# Patient Record
Sex: Female | Born: 1945 | Race: White | Hispanic: No | Marital: Married | Smoking: Never smoker
Health system: Southern US, Community
[De-identification: ages and names within clinical notes are randomized; demographics above are authoritative.]

## PROBLEM LIST (undated history)

## (undated) DIAGNOSIS — R011 Cardiac murmur, unspecified: Secondary | ICD-10-CM

## (undated) DIAGNOSIS — M1990 Unspecified osteoarthritis, unspecified site: Secondary | ICD-10-CM

## (undated) DIAGNOSIS — K219 Gastro-esophageal reflux disease without esophagitis: Secondary | ICD-10-CM

## (undated) DIAGNOSIS — M549 Dorsalgia, unspecified: Secondary | ICD-10-CM

## (undated) DIAGNOSIS — E119 Type 2 diabetes mellitus without complications: Secondary | ICD-10-CM

## (undated) DIAGNOSIS — I10 Essential (primary) hypertension: Secondary | ICD-10-CM

## (undated) HISTORY — PX: CARPAL TUNNEL RELEASE: SHX101

## (undated) HISTORY — PX: TONSILLECTOMY: SUR1361

## (undated) HISTORY — PX: ABDOMINAL HYSTERECTOMY: SHX81

## (undated) HISTORY — PX: BACK SURGERY: SHX140

---

## 1997-09-22 ENCOUNTER — Ambulatory Visit (HOSPITAL_COMMUNITY): Admission: RE | Admit: 1997-09-22 | Discharge: 1997-09-22 | Payer: Self-pay | Admitting: Internal Medicine

## 1999-01-14 ENCOUNTER — Ambulatory Visit (HOSPITAL_COMMUNITY): Admission: RE | Admit: 1999-01-14 | Discharge: 1999-01-14 | Payer: Self-pay | Admitting: Internal Medicine

## 1999-01-14 ENCOUNTER — Encounter: Payer: Self-pay | Admitting: Internal Medicine

## 1999-08-08 ENCOUNTER — Other Ambulatory Visit: Admission: RE | Admit: 1999-08-08 | Discharge: 1999-08-08 | Payer: Self-pay | Admitting: Internal Medicine

## 2000-11-08 ENCOUNTER — Encounter (INDEPENDENT_AMBULATORY_CARE_PROVIDER_SITE_OTHER): Payer: Self-pay | Admitting: *Deleted

## 2000-11-08 ENCOUNTER — Ambulatory Visit (HOSPITAL_COMMUNITY): Admission: RE | Admit: 2000-11-08 | Discharge: 2000-11-08 | Payer: Self-pay | Admitting: Gastroenterology

## 2010-06-10 ENCOUNTER — Emergency Department (HOSPITAL_COMMUNITY): Payer: Medicare Other

## 2010-06-10 ENCOUNTER — Emergency Department (HOSPITAL_COMMUNITY)
Admission: EM | Admit: 2010-06-10 | Discharge: 2010-06-10 | Disposition: A | Discharge status: Home / Self Care | Payer: Medicare Other | Attending: Emergency Medicine | Admitting: Emergency Medicine

## 2010-06-10 ENCOUNTER — Other Ambulatory Visit: Payer: Self-pay

## 2010-06-10 DIAGNOSIS — E119 Type 2 diabetes mellitus without complications: Secondary | ICD-10-CM | POA: Insufficient documentation

## 2010-06-10 DIAGNOSIS — M719 Bursopathy, unspecified: Secondary | ICD-10-CM | POA: Insufficient documentation

## 2010-06-10 DIAGNOSIS — R42 Dizziness and giddiness: Secondary | ICD-10-CM | POA: Insufficient documentation

## 2010-06-10 DIAGNOSIS — I10 Essential (primary) hypertension: Secondary | ICD-10-CM | POA: Insufficient documentation

## 2010-06-10 DIAGNOSIS — M549 Dorsalgia, unspecified: Secondary | ICD-10-CM | POA: Insufficient documentation

## 2010-06-10 DIAGNOSIS — M25519 Pain in unspecified shoulder: Secondary | ICD-10-CM | POA: Insufficient documentation

## 2010-06-10 DIAGNOSIS — E7800 Pure hypercholesterolemia, unspecified: Secondary | ICD-10-CM | POA: Insufficient documentation

## 2010-06-10 DIAGNOSIS — R110 Nausea: Secondary | ICD-10-CM | POA: Insufficient documentation

## 2010-06-10 DIAGNOSIS — M67919 Unspecified disorder of synovium and tendon, unspecified shoulder: Secondary | ICD-10-CM | POA: Insufficient documentation

## 2010-06-10 LAB — BASIC METABOLIC PANEL
BUN: 14 mg/dL (ref 6–23)
CO2: 29 mEq/L (ref 19–32)
Calcium: 10 mg/dL (ref 8.4–10.5)
Chloride: 100 mEq/L (ref 96–112)
Creatinine, Ser: 1.03 mg/dL (ref 0.4–1.2)
GFR calc Af Amer: >60 mL/min (ref >60)
GFR calc non Af Amer: 54 mL/min — ABNORMAL LOW (ref >60)
Glucose, Bld: 103 mg/dL — ABNORMAL HIGH (ref 70–99)
Potassium: 4.3 mEq/L (ref 3.5–5.1)
Sodium: 140 mEq/L (ref 135–145)

## 2010-06-10 LAB — CBC
HCT: 40.2 % (ref 36.0–46.0)
Hemoglobin: 14.1 g/dL (ref 12.0–15.0)
MCH: 31.5 pg (ref 26.0–34.0)
MCHC: 35.1 g/dL (ref 30.0–36.0)
MCV: 89.7 fL (ref 78.0–100.0)
Platelets: 221 10*3/uL (ref 150–400)
RBC: 4.48 MIL/uL (ref 3.87–5.11)
RDW: 12.5 % (ref 11.5–15.5)
WBC: 8.3 10*3/uL (ref 4.0–10.5)

## 2010-06-10 LAB — DIFFERENTIAL
Basophils Absolute: 0.1 10*3/uL (ref 0.0–0.1)
Basophils Relative: 1 % (ref 0–1)
Eosinophils Absolute: 0.3 10*3/uL (ref 0.0–0.7)
Eosinophils Relative: 4 % (ref 0–5)
Lymphocytes Relative: 30 % (ref 12–46)
Lymphs Abs: 2.5 10*3/uL (ref 0.7–4.0)
Monocytes Absolute: 0.5 10*3/uL (ref 0.1–1.0)
Monocytes Relative: 5 % (ref 3–12)
Neutro Abs: 5 10*3/uL (ref 1.7–7.7)
Neutrophils Relative %: 60 % (ref 43–77)

## 2010-06-10 LAB — D-DIMER, QUANTITATIVE: D-Dimer, Quant: 0.55 ug/mL-FEU — ABNORMAL HIGH (ref 0.00–0.48)

## 2010-06-10 LAB — URINALYSIS, ROUTINE W REFLEX MICROSCOPIC
Bilirubin Urine: NEGATIVE
Ketones, ur: NEGATIVE mg/dL
Nitrite: NEGATIVE
Protein, ur: NEGATIVE mg/dL
pH: 5.5 (ref 5.0–8.0)

## 2010-06-10 LAB — POCT CARDIAC MARKERS
Myoglobin, poc: 65.6 ng/mL (ref 12–200)
Troponin i, poc: <0.05 ng/mL (ref 0.00–0.09)

## 2010-06-10 LAB — URINE MICROSCOPIC-ADD ON

## 2010-06-10 MED ORDER — IOHEXOL 300 MG/ML  SOLN
100.0000 mL | Freq: Once | INTRAMUSCULAR | Status: AC | PRN
  Administered 2010-06-10: 100 mL via INTRAVENOUS

## 2010-09-09 NOTE — Procedures (Signed)
[facility]  Patient:    Robyn [name], Robyn [name]                     MRN: [id number] Proc. Date: 11/08/00 Attending:  Verlin Grills, M.D. CC:         Modesta Messing, M.D.   Procedure Report  PROCEDURE PERFORMED:  Colonoscopy and rectal polypectomy.  ENDOSCOPIST:  Verlin Grills, M.D.  INDICATIONS:  Ms. Robyn [name] (date of birth 11/04/1945) is a 65 year old female who is due for her first surveillance colonoscopy with polypectomy to prevent colon cancer.  I discussed with Ms. Robyn [name] the complications associated with colonoscopy and polypectomy including a 15 per 1000 risk of bleeding and 4 per 1000 risk of colon rupture requiring emergency surgery.  Ms. Robyn [name] has signed the operative permit.  PREMEDICATIONS:  Demerol 50 mg and Versed 10 mg.  ENDOSCOPE:  Olympus pediatric colonoscope.  DESCRIPTION OF PROCEDURE:  After obtaining informed consent, Ms. Robyn [name] was placed in the left lateral decubitus position.  I administered intravenous Demerol and intravenous Versed to achieve conscious sedation for the procedure.  The patients blood pressure, oxygen saturation, and cardiac rhythm were monitored throughout the procedure and documented in the medical record.  Anal inspection was normal.  Digital rectal exam was normal.  The Olympus pediatric video colonoscope was introduced into the rectum and easily advanced to the cecum.  Colonic preparation for the exam today was excellent.  Rectum:  From the mid rectum, a 3 mm sessile polyp was removed with electrocautery snare and submitted for pathologic interpretation.  Sigmoid colon and descending colon normal.  Splenic flexure normal. Transverse colon normal.  Hepatic flexure normal.  Ascending colon normal.  Cecum and ileocecal valve normal.  ASSESSMENT:  From the mid rectum, a 3 mm sessile polyp was removed with the electrocautery snare.  Otherwise, normal proctocolonoscopy to the  cecum.  RECOMMENDATIONS:  If the rectal polyp returns neoplastic pathologically, Ms. Robyn [name] should undergo a repeat colonoscopy in five years.  If the rectal polyp is nonneoplastic pathologically, Ms. Robyn [name] should undergo a repeat surveillance colonoscopy in approximately 10 years. DD:  11/08/00 TD:  11/08/00 Job: [id number]

## 2011-05-25 ENCOUNTER — Other Ambulatory Visit: Payer: Self-pay | Admitting: Internal Medicine

## 2011-05-25 DIAGNOSIS — Z1231 Encounter for screening mammogram for malignant neoplasm of breast: Secondary | ICD-10-CM

## 2011-06-06 ENCOUNTER — Ambulatory Visit
Admission: RE | Admit: 2011-06-06 | Discharge: 2011-06-06 | Disposition: A | Discharge status: Home / Self Care | Payer: Medicare Other | Source: Ambulatory Visit | Attending: Internal Medicine | Admitting: Internal Medicine

## 2011-06-06 DIAGNOSIS — Z1231 Encounter for screening mammogram for malignant neoplasm of breast: Secondary | ICD-10-CM

## 2012-05-30 ENCOUNTER — Other Ambulatory Visit: Payer: Self-pay | Admitting: Internal Medicine

## 2012-05-30 DIAGNOSIS — Z1231 Encounter for screening mammogram for malignant neoplasm of breast: Secondary | ICD-10-CM

## 2012-06-18 ENCOUNTER — Ambulatory Visit
Admission: RE | Admit: 2012-06-18 | Discharge: 2012-06-18 | Disposition: A | Discharge status: Home / Self Care | Payer: Medicare Other | Source: Ambulatory Visit | Attending: Internal Medicine | Admitting: Internal Medicine

## 2012-06-18 DIAGNOSIS — Z1231 Encounter for screening mammogram for malignant neoplasm of breast: Secondary | ICD-10-CM

## 2012-06-29 ENCOUNTER — Observation Stay (HOSPITAL_COMMUNITY)
Admission: EM | Admit: 2012-06-29 | Discharge: 2012-06-30 | Disposition: A | Discharge status: Home / Self Care | Payer: Medicare Other | Attending: Family Medicine | Admitting: Family Medicine

## 2012-06-29 ENCOUNTER — Encounter (HOSPITAL_COMMUNITY): Payer: Self-pay | Admitting: Emergency Medicine

## 2012-06-29 ENCOUNTER — Emergency Department (HOSPITAL_COMMUNITY): Payer: Medicare Other

## 2012-06-29 DIAGNOSIS — E119 Type 2 diabetes mellitus without complications: Secondary | ICD-10-CM | POA: Insufficient documentation

## 2012-06-29 DIAGNOSIS — R0781 Pleurodynia: Secondary | ICD-10-CM | POA: Diagnosis present

## 2012-06-29 DIAGNOSIS — I10 Essential (primary) hypertension: Secondary | ICD-10-CM

## 2012-06-29 DIAGNOSIS — R0789 Other chest pain: Secondary | ICD-10-CM

## 2012-06-29 DIAGNOSIS — R071 Chest pain on breathing: Principal | ICD-10-CM | POA: Insufficient documentation

## 2012-06-29 HISTORY — DX: Dorsalgia, unspecified: M54.9

## 2012-06-29 HISTORY — DX: Unspecified osteoarthritis, unspecified site: M19.90

## 2012-06-29 HISTORY — DX: Type 2 diabetes mellitus without complications: E11.9

## 2012-06-29 HISTORY — DX: Essential (primary) hypertension: I10

## 2012-06-29 HISTORY — DX: Cardiac murmur, unspecified: R01.1

## 2012-06-29 LAB — COMPREHENSIVE METABOLIC PANEL
Alkaline Phosphatase: 62 U/L (ref 39–117)
BUN: 16 mg/dL (ref 6–23)
Chloride: 101 mEq/L (ref 96–112)
GFR calc Af Amer: 73 mL/min — ABNORMAL LOW (ref >90)
Glucose, Bld: 141 mg/dL — ABNORMAL HIGH (ref 70–99)
Potassium: 3.9 mEq/L (ref 3.5–5.1)
Total Bilirubin: 0.4 mg/dL (ref 0.3–1.2)

## 2012-06-29 LAB — GLUCOSE, CAPILLARY: Glucose-Capillary: 161 mg/dL — ABNORMAL HIGH (ref 70–99)

## 2012-06-29 LAB — CREATININE, SERUM
Creatinine, Ser: 0.83 mg/dL (ref 0.50–1.10)
GFR calc Af Amer: 82 mL/min — ABNORMAL LOW (ref >90)

## 2012-06-29 LAB — CBC
HCT: 36.5 % (ref 36.0–46.0)
Hemoglobin: 12.7 g/dL (ref 12.0–15.0)
MCHC: 34.8 g/dL (ref 30.0–36.0)
MCV: 89.9 fL (ref 78.0–100.0)
Platelets: 224 10*3/uL (ref 150–400)
RBC: 3.93 MIL/uL (ref 3.87–5.11)
RDW: 12.7 % (ref 11.5–15.5)
WBC: 9.9 10*3/uL (ref 4.0–10.5)
WBC: 10 10*3/uL (ref 4.0–10.5)

## 2012-06-29 LAB — POCT I-STAT TROPONIN I: Troponin i, poc: 0 ng/mL (ref 0.00–0.08)

## 2012-06-29 LAB — TROPONIN I: Troponin I: <0.3 ng/mL (ref <0.30)

## 2012-06-29 MED ORDER — SODIUM CHLORIDE 0.9 % IJ SOLN
3.0000 mL | Freq: Two times a day (BID) | INTRAMUSCULAR | Status: DC
  Administered 2012-06-29 – 2012-06-30 (×2): 3 mL via INTRAVENOUS

## 2012-06-29 MED ORDER — POLYETHYLENE GLYCOL 3350 17 G PO PACK
17.0000 g | PACK | Freq: Every day | ORAL | Status: DC | PRN
  Filled 2012-06-29: qty 1

## 2012-06-29 MED ORDER — CHOLECALCIFEROL 10 MCG (400 UNIT) PO TABS
400.0000 [IU] | ORAL_TABLET | Freq: Every day | ORAL | Status: DC
  Administered 2012-06-29 – 2012-06-30 (×2): 400 [IU] via ORAL
  Filled 2012-06-29 (×2): qty 1

## 2012-06-29 MED ORDER — MORPHINE SULFATE 4 MG/ML IJ SOLN
4.0000 mg | Freq: Once | INTRAMUSCULAR | Status: AC
  Administered 2012-06-29: 4 mg via INTRAVENOUS
  Filled 2012-06-29: qty 1

## 2012-06-29 MED ORDER — ONDANSETRON HCL 4 MG/2ML IJ SOLN: 4.0000 mg | Freq: Four times a day (QID) | INTRAMUSCULAR | Status: DC | PRN

## 2012-06-29 MED ORDER — GLIMEPIRIDE 4 MG PO TABS
4.0000 mg | ORAL_TABLET | Freq: Every day | ORAL | Status: DC
  Administered 2012-06-30: 4 mg via ORAL
  Filled 2012-06-29 (×2): qty 1

## 2012-06-29 MED ORDER — SODIUM CHLORIDE 0.9 % IV SOLN: 250.0000 mL | INTRAVENOUS | Status: DC | PRN

## 2012-06-29 MED ORDER — ASPIRIN EC 81 MG PO TBEC
81.0000 mg | DELAYED_RELEASE_TABLET | Freq: Every day | ORAL | Status: DC
  Administered 2012-06-30: 81 mg via ORAL
  Filled 2012-06-29 (×2): qty 1

## 2012-06-29 MED ORDER — FENTANYL CITRATE 0.05 MG/ML IJ SOLN
50.0000 ug | Freq: Once | INTRAMUSCULAR | Status: AC
  Administered 2012-06-29: 50 ug via INTRAVENOUS
  Filled 2012-06-29: qty 2

## 2012-06-29 MED ORDER — LISINOPRIL-HYDROCHLOROTHIAZIDE 20-25 MG PO TABS
1.0000 | ORAL_TABLET | Freq: Every day | ORAL | Status: DC
Start: 2012-06-29 — End: 2012-06-29

## 2012-06-29 MED ORDER — ATORVASTATIN CALCIUM 40 MG PO TABS
40.0000 mg | ORAL_TABLET | Freq: Every day | ORAL | Status: DC
  Filled 2012-06-29: qty 1

## 2012-06-29 MED ORDER — INSULIN ASPART 100 UNIT/ML ~~LOC~~ SOLN: 0.0000 [IU] | Freq: Every day | SUBCUTANEOUS | Status: DC

## 2012-06-29 MED ORDER — ZOLPIDEM TARTRATE 5 MG PO TABS: 5.0000 mg | ORAL_TABLET | Freq: Every evening | ORAL | Status: DC | PRN

## 2012-06-29 MED ORDER — ROSUVASTATIN CALCIUM 20 MG PO TABS
20.0000 mg | ORAL_TABLET | Freq: Every day | ORAL | Status: DC
  Administered 2012-06-29: 20 mg via ORAL
  Filled 2012-06-29 (×2): qty 1

## 2012-06-29 MED ORDER — ASPIRIN EC 325 MG PO TBEC: 325.0000 mg | DELAYED_RELEASE_TABLET | Freq: Every day | ORAL | Status: DC

## 2012-06-29 MED ORDER — SODIUM CHLORIDE 0.9 % IJ SOLN: 3.0000 mL | INTRAMUSCULAR | Status: DC | PRN

## 2012-06-29 MED ORDER — HYDROCODONE-ACETAMINOPHEN 5-325 MG PO TABS
1.0000 | ORAL_TABLET | Freq: Two times a day (BID) | ORAL | Status: DC | PRN
  Administered 2012-06-29 – 2012-06-30 (×3): 1 via ORAL
  Filled 2012-06-29 (×3): qty 1

## 2012-06-29 MED ORDER — INSULIN GLARGINE 100 UNIT/ML ~~LOC~~ SOLN
10.0000 [IU] | Freq: Every day | SUBCUTANEOUS | Status: DC
  Administered 2012-06-29: 10 [IU] via SUBCUTANEOUS

## 2012-06-29 MED ORDER — HYDROCHLOROTHIAZIDE 25 MG PO TABS
25.0000 mg | ORAL_TABLET | Freq: Every day | ORAL | Status: DC
  Administered 2012-06-30: 25 mg via ORAL
  Filled 2012-06-29: qty 1

## 2012-06-29 MED ORDER — INSULIN ASPART 100 UNIT/ML ~~LOC~~ SOLN
0.0000 [IU] | Freq: Three times a day (TID) | SUBCUTANEOUS | Status: DC
  Administered 2012-06-29: 3 [IU] via SUBCUTANEOUS
  Administered 2012-06-30: 5 [IU] via SUBCUTANEOUS
  Administered 2012-06-30: 3 [IU] via SUBCUTANEOUS

## 2012-06-29 MED ORDER — SODIUM CHLORIDE 0.9 % IJ SOLN: 3.0000 mL | Freq: Two times a day (BID) | INTRAMUSCULAR | Status: DC

## 2012-06-29 MED ORDER — HEPARIN SODIUM (PORCINE) 5000 UNIT/ML IJ SOLN
5000.0000 [IU] | Freq: Three times a day (TID) | INTRAMUSCULAR | Status: DC
  Administered 2012-06-29 – 2012-06-30 (×2): 5000 [IU] via SUBCUTANEOUS
  Filled 2012-06-29 (×5): qty 1

## 2012-06-29 MED ORDER — LISINOPRIL 20 MG PO TABS
20.0000 mg | ORAL_TABLET | Freq: Every day | ORAL | Status: DC
  Administered 2012-06-30: 20 mg via ORAL
  Filled 2012-06-29: qty 1

## 2012-06-29 MED ORDER — MORPHINE SULFATE 4 MG/ML IJ SOLN
4.0000 mg | INTRAMUSCULAR | Status: DC | PRN
  Administered 2012-06-29: 4 mg via INTRAVENOUS
  Filled 2012-06-29: qty 1

## 2012-06-29 MED ORDER — ONDANSETRON HCL 4 MG PO TABS: 4.0000 mg | ORAL_TABLET | Freq: Four times a day (QID) | ORAL | Status: DC | PRN

## 2012-06-29 MED ORDER — INSULIN ASPART 100 UNIT/ML ~~LOC~~ SOLN
4.0000 [IU] | Freq: Three times a day (TID) | SUBCUTANEOUS | Status: DC
  Administered 2012-06-29 – 2012-06-30 (×3): 4 [IU] via SUBCUTANEOUS

## 2012-06-29 NOTE — ED Notes (Signed)
Pt. Stated, i staTTED HAVING SHOULDER PAIN LAST NIGHT BEFORE GOING TO BED AND THIS AM AROUND 500 i STARTED HAVING PAIN UNDERNEATH LEFT breast.  This is new pain

## 2012-06-29 NOTE — ED Notes (Signed)
Patient states that she took 5 baby asa after calling EMS

## 2012-06-29 NOTE — ED Notes (Signed)
Urine at bedside if needed. 

## 2012-06-29 NOTE — ED Notes (Signed)
Report given to Alan, RN

## 2012-06-29 NOTE — ED Notes (Signed)
Patient transported to X-ray 

## 2012-06-29 NOTE — ED Provider Notes (Signed)
History     CSN: [id number]  Arrival date & time 06/29/12  1036   First MD Initiated Contact with Patient 06/29/12 1133      Chief Complaint  Patient presents with  . Chest Pain    (Consider location/radiation/quality/duration/timing/severity/associated sxs/prior treatment) HPI Complains of left anterior chest pain covering a 1 cm area at left costal margin onset upon awakening this morning. Patient states had shoulder pain at left side last night. Treat with aspirin prior to coming here. Pain is worse with deep inspiration or sitting upright improve with lying supine. No cough no fever.  Past Medical History  Diagnosis Date  . Diabetes mellitus without complication   . Hypertension   . Back pain    hypercholesterolemia  History reviewed. No pertinent past surgical history.  History reviewed. No pertinent family history.  History  Substance Use Topics  . Smoking status: Not on file  . Smokeless tobacco: Not on file  . Alcohol Use: Yes   not a nonsmoker no drugs   OB History   Grav Para Term Preterm Abortions TAB SAB Ect Mult Living                  Review of Systems  Cardiovascular: Positive for chest pain.  All other systems reviewed and are negative.    Allergies  Codeine  Home Medications   Current Outpatient Rx  Name  Route  Sig  Dispense  Refill  . aspirin EC 81 MG tablet   Oral   Take 81 mg by mouth daily.         . Cholecalciferol (VITAMIN D PO)   Oral   Take 1 capsule by mouth daily.         . Cyanocobalamin (VITAMIN B-12 PO)   Oral   Take 1 tablet by mouth daily.         Marland Kitchen glimepiride (AMARYL) 4 MG tablet   Oral   Take 4 mg by mouth daily before breakfast.         . glyBURIDE (DIABETA) 5 MG tablet   Oral   Take 5 mg by mouth 2 (two) times daily with a meal.         . HYDROcodone-acetaminophen (NORCO/VICODIN) 5-325 MG per tablet   Oral   Take 1 tablet by mouth 2 (two) times daily as needed for pain.         Marland Kitchen  lisinopril-hydrochlorothiazide (PRINZIDE,ZESTORETIC) 20-25 MG per tablet   Oral   Take 1 tablet by mouth daily.         . metFORMIN (GLUCOPHAGE) 1000 MG tablet   Oral   Take 1,000 mg by mouth 2 (two) times daily with a meal.         . rosuvastatin (CRESTOR) 20 MG tablet   Oral   Take 20 mg by mouth daily.         Marland Kitchen VITAMIN E PO   Oral   Take 1 capsule by mouth daily.           BP 147/75  Pulse 76  Temp(Src) 98.3 F (36.8 C) (Oral)  Resp 22  SpO2 98%  Physical Exam  Nursing note and vitals reviewed. Constitutional: She appears well-developed and well-nourished. She appears distressed.  Glasgow Coma Score 15 appears uncomfortable  HENT:  Head: Normocephalic and atraumatic.  Eyes: Conjunctivae are normal. Pupils are equal, round, and reactive to light.  Neck: Neck supple. No tracheal deviation present. No thyromegaly present.  Cardiovascular: Normal rate  and regular rhythm.   No murmur heard. Pulmonary/Chest: Effort normal and breath sounds normal. She exhibits no tenderness.  Abdominal: Soft. Bowel sounds are normal. She exhibits no distension. There is no tenderness.  Musculoskeletal: Normal range of motion. She exhibits no edema and no tenderness.  Neurological: She is alert. Coordination normal.  Skin: Skin is warm and dry. No rash noted.  Psychiatric: She has a normal mood and affect.    ED Course  Procedures (including critical care time)  Labs Reviewed  CBC  COMPREHENSIVE METABOLIC PANEL  D-DIMER, QUANTITATIVE  POCT I-STAT TROPONIN I   Dg Chest 2 View  06/29/2012  *RADIOLOGY REPORT*  Clinical Data: Chest pain  CHEST - 2 VIEW  Comparison: CTA chest dated 06/10/2010  Findings: Low lung volumes.  No focal consolidation.  No pleural effusion or pneumothorax.  The heart is top normal in size for inspiration.  Degenerative changes of the visualized thoracolumbar spine.  IMPRESSION: No evidence of acute cardiopulmonary disease.   Original Report Authenticated  By: Charline Bills, M.D.      No diagnosis found.   Date: 06/29/2012  Rate: 85  Rhythm: normal sinus rhythm  QRS Axis: normal  Intervals: normal  ST/T Wave abnormalities: normal  Conduction Disutrbances: none  Narrative Interpretation: unremarkable Chest x-ray reviewed by me Results for orders placed during the hospital encounter of 06/29/12  CBC      Result Value Range   WBC 9.9  4.0 - 10.5 K/uL   RBC 4.06  3.87 - 5.11 MIL/uL   Hemoglobin 12.7  12.0 - 15.0 g/dL   HCT 16.1  09.6 - 04.5 %   MCV 89.9  78.0 - 100.0 fL   MCH 31.3  26.0 - 34.0 pg   MCHC 34.8  30.0 - 36.0 g/dL   RDW 40.9  81.1 - 91.4 %   Platelets 248  150 - 400 K/uL  COMPREHENSIVE METABOLIC PANEL      Result Value Range   Sodium 140  135 - 145 mEq/L   Potassium 3.9  3.5 - 5.1 mEq/L   Chloride 101  96 - 112 mEq/L   CO2 28  19 - 32 mEq/L   Glucose, Bld 141 (*) 70 - 99 mg/dL   BUN 16  6 - 23 mg/dL   Creatinine, Ser 7.82  0.50 - 1.10 mg/dL   Calcium 95.6  8.4 - 21.3 mg/dL   Total Protein 7.1  6.0 - 8.3 g/dL   Albumin 3.9  3.5 - 5.2 g/dL   AST 18  0 - 37 U/L   ALT 24  0 - 35 U/L   Alkaline Phosphatase 62  39 - 117 U/L   Total Bilirubin 0.4  0.3 - 1.2 mg/dL   GFR calc non Af Amer 63 (*) >90 mL/min   GFR calc Af Amer 73 (*) >90 mL/min  D-DIMER, QUANTITATIVE      Result Value Range   D-Dimer, Quant 0.38  0.00 - 0.48 ug/mL-FEU  POCT I-STAT TROPONIN I      Result Value Range   Troponin i, poc 0.00  0.00 - 0.08 ng/mL   Comment 3            Dg Chest 2 View  06/29/2012  *RADIOLOGY REPORT*  Clinical Data: Chest pain  CHEST - 2 VIEW  Comparison: CTA chest dated 06/10/2010  Findings: Low lung volumes.  No focal consolidation.  No pleural effusion or pneumothorax.  The heart is top normal in size for inspiration.  Degenerative changes of the visualized thoracolumbar spine.  IMPRESSION: No evidence of acute cardiopulmonary disease.   Original Report Authenticated By: Charline Bills, M.D.    Mm Digital  Screening  06/18/2012  *RADIOLOGY REPORT*  Clinical Data: Screening.  DIGITAL BILATERAL SCREENING MAMMOGRAM WITH CAD  Comparison:  06/06/2011 [facility].  02/19/2008 and 06/27/2006 from [facility].  FINDINGS:  ACR Breast Density Category 2: There are scattered fibroglandular densities.  There is no suspicious dominant mass, architectural distortion, or calcification to suggest malignancy.  Images were processed with CAD.  IMPRESSION: No mammographic evidence of malignancy.  A result letter of this screening mammogram will be mailed directly to the patient.  RECOMMENDATION: Screening mammogram in one year. (Code:SM-B-01Y)  BI-RADS CATEGORY 1:  Negative.   Original Report Authenticated By: Cain Saupe, M.D.     1:40 PM pain is improved after treatment with morphine and is mild at present   MDM  I doubt acute coronary syndrome given patient's history. However she has multiple cardiac risk factors and is elderly. Spoke with Dr.Feiliz plan step down unit, 23 hours observation Diagnosis atypical chest pain        Doug Sou, MD 06/29/12 1346

## 2012-06-29 NOTE — H&P (Signed)
[facility] History and Physical  Robyn [name] RUE:[id number] DOB: 11/18/1945 DOA: 06/29/2012  Referring physician: Dr. Ethelda Chick  PCP: No primary provider on file.  Specialists: none  Chief Complaint: CP  HPI: Robyn [name] is a 67 y.o. female  Past medical history of hypertension, diabetes mellitus hyperlipidemia, and family history of heart attacks that comes in complaining of chest pain. She relates started one day prior to admission with soreness in her shoulder and this morning she started having chest pain made worse with movement but not with exertion better with morphine. Deep breath make it worst she  Can point it out with  one finger. She denies shortness of breath, fever chills or cough. In the emergency room chest x-ray was done that does not show any pneumonia, d-dimer was done that was 0.3, first set of cardiac enzymes negative. EKG shows normal sinus rhythm no T wave abnormalities and normal axis.   Review of Systems: The patient denies anorexia, fever, weight loss,, vision loss, decreased hearing, hoarseness,  syncope, dyspnea on exertion, peripheral edema, balance deficits, hemoptysis, abdominal pain, melena, hematochezia, severe indigestion/heartburn, hematuria, incontinence, genital sores, muscle weakness, suspicious skin lesions, transient blindness, difficulty walking, depression, unusual weight change, abnormal bleeding, enlarged lymph nodes, angioedema, and breast masses.    Past Medical History  Diagnosis Date  . Diabetes mellitus without complication   . Hypertension   . Back pain    Past Surgical History  Procedure Laterality Date  . Abdominal hysterectomy     Social History:  reports that she has never smoked. She does not have any smokeless tobacco history on file. She reports that she drinks about 1.2 ounces of alcohol per week. She reports that she does not use illicit drugs. His at home by herself can perform all her ADLs  Allergies  Allergen  Reactions  . Codeine Itching    Family History  Problem Relation Age of Onset  . Alzheimer's disease Mother   . Heart attack Father      Prior to Admission medications   Medication Sig Start Date End Date Taking? Authorizing Provider  aspirin EC 81 MG tablet Take 81 mg by mouth daily.   Yes Historical Provider, MD  Cholecalciferol (VITAMIN D PO) Take 1 capsule by mouth daily.   Yes Historical Provider, MD  Cyanocobalamin (VITAMIN B-12 PO) Take 1 tablet by mouth daily.   Yes Historical Provider, MD  glimepiride (AMARYL) 4 MG tablet Take 4 mg by mouth daily before breakfast.   Yes Historical Provider, MD  glyBURIDE (DIABETA) 5 MG tablet Take 5 mg by mouth 2 (two) times daily with a meal.   Yes Historical Provider, MD  HYDROcodone-acetaminophen (NORCO/VICODIN) 5-325 MG per tablet Take 1 tablet by mouth 2 (two) times daily as needed for pain.   Yes Historical Provider, MD  lisinopril-hydrochlorothiazide (PRINZIDE,ZESTORETIC) 20-25 MG per tablet Take 1 tablet by mouth daily.   Yes Historical Provider, MD  metFORMIN (GLUCOPHAGE) 1000 MG tablet Take 1,000 mg by mouth 2 (two) times daily with a meal.   Yes Historical Provider, MD  rosuvastatin (CRESTOR) 20 MG tablet Take 20 mg by mouth daily.   Yes Historical Provider, MD  VITAMIN E PO Take 1 capsule by mouth daily.   Yes Historical Provider, MD   Physical Exam: Filed Vitals:   06/29/12 1037 06/29/12 1213 06/29/12 1248 06/29/12 1358  BP: 180/88 147/75 138/63 151/72  Pulse: 85 76 80 82  Temp: 98.3 F (36.8 C)  98.2 F (36.8 C)  TempSrc: Oral  Oral   Resp: 26 22 18  97  SpO2: 98% 98% 99% 97%    BP 151/72  Pulse 82  Temp(Src) 98.2 F (36.8 C) (Oral)  Resp 97  SpO2 97%  General Appearance:    Alert, cooperative, no distress, appears stated age  Head:    Normocephalic, without obvious abnormality, atraumatic  Eyes:    PERRL, conjunctiva/corneas clear, EOM's intact, fundi    benign, both eyes  Ears:    Nose:   Throat:   dry mucous  member   Neck:   Supple, symmetrical, trachea midline, no adenopathy;    thyroid:  no enlargement/tenderness/nodules; no carotid   bruit or JVD  Back:     Symmetric, no curvature, ROM normal, no CVA tenderness  Lungs:     Clear to auscultation bilaterally, respirations unlabored  Chest Wall:    No tenderness or deformity, not reproducible by palpation    Heart:    Regular rate and rhythm, S1 and S2 normal, no murmur, rub   or gallop  Breast Exam:    No tenderness, masses, or nipple abnormality no rashes   Abdomen:     Soft, non-tender, bowel sounds active all four quadrants,    no masses, no organomegaly        Extremities:   Extremities normal, atraumatic, no cyanosis or edema  Pulses:   2+ and symmetric all extremities  Skin:   Skin color, texture, turgor normal, no rashes or lesions  Lymph nodes:   Cervical, supraclavicular, and axillary nodes normal  Neurologic:   CNII-XII intact, normal strength, sensation and reflexes    throughout    Labs on Admission:  Basic Metabolic Panel:  Recent Labs Lab 06/29/12 1140  NA 140  K 3.9  CL 101  CO2 28  GLUCOSE 141*  BUN 16  CREATININE 0.91  CALCIUM 10.4   Liver Function Tests:  Recent Labs Lab 06/29/12 1140  AST 18  ALT 24  ALKPHOS 62  BILITOT 0.4  PROT 7.1  ALBUMIN 3.9   No results found for this basename: LIPASE, AMYLASE,  in the last 168 hours No results found for this basename: AMMONIA,  in the last 168 hours CBC:  Recent Labs Lab 06/29/12 1140  WBC 9.9  HGB 12.7  HCT 36.5  MCV 89.9  PLT 248   Cardiac Enzymes: No results found for this basename: CKTOTAL, CKMB, CKMBINDEX, TROPONINI,  in the last 168 hours  BNP (last 3 results) No results found for this basename: PROBNP,  in the last 8760 hours CBG: No results found for this basename: GLUCAP,  in the last 168 hours  Radiological Exams on Admission: Dg Chest 2 View  06/29/2012  *RADIOLOGY REPORT*  Clinical Data: Chest pain  CHEST - 2 VIEW  Comparison:  CTA chest dated 06/10/2010  Findings: Low lung volumes.  No focal consolidation.  No pleural effusion or pneumothorax.  The heart is top normal in size for inspiration.  Degenerative changes of the visualized thoracolumbar spine.  IMPRESSION: No evidence of acute cardiopulmonary disease.   Original Report Authenticated By: Charline Bills, M.D.     EKG: Independently reviewed. Normal sinus rhythm, no T wave inversion  Assessment/Plan  Pleuritic chest pain: - Admit to telemetry, cycle cardiac enzymes x3, get an EKG in the morning, check a 2-D echo, TSH morphine for pain. Differential for pleuritic chest pain will be pulmonary embolism she is a low probability for any PEs and her d-dimer is less than 0.5.  We'll likely pericarditis that she doesn't have diffuse ST segment elevation, her pain is not made worse by being fed oral better by sitting up and leaning forward. It is unlikely pneumonia she has no fever cough and leukocytosis and chest x-ray does not show an infiltrate. The pain is nonreproducible by palpation, she has no fractures on chest x-ray. So go ahead and admit her for 23 hour observation. - Also check a TSH. She's currently on vitamin B12 replacement therapy. Also check an RBC folate and B12    Diabetes mellitus type 2 without retinopathy: - Continue albuterol glyburide, DC metformin and start on Lantus and sliding scale. Get a hemoglobin A1c.    Hypertension - Blood pressure is mildly elevated. Continue current home medications.    Code Status: Full Family Communication: friend Disposition Plan: observation (indicate anticipated LOS)  Time spent: 60 minutes  Marinda Elk [facility] Pager [phone]  If 7PM-7AM, please contact night-coverage [email or web address] Password [facility] 06/29/2012, 2:13 PM

## 2012-06-30 LAB — COMPREHENSIVE METABOLIC PANEL
ALT: 18 U/L (ref 0–35)
Albumin: 3.2 g/dL — ABNORMAL LOW (ref 3.5–5.2)
Alkaline Phosphatase: 52 U/L (ref 39–117)
BUN: 14 mg/dL (ref 6–23)
Chloride: 102 mEq/L (ref 96–112)
Glucose, Bld: 122 mg/dL — ABNORMAL HIGH (ref 70–99)
Potassium: 3.9 mEq/L (ref 3.5–5.1)
Sodium: 138 mEq/L (ref 135–145)
Total Bilirubin: 0.4 mg/dL (ref 0.3–1.2)
Total Protein: 6.1 g/dL (ref 6.0–8.3)

## 2012-06-30 LAB — GLUCOSE, CAPILLARY
Glucose-Capillary: 207 mg/dL — ABNORMAL HIGH (ref 70–99)
Glucose-Capillary: 163 mg/dL — ABNORMAL HIGH (ref 70–99)

## 2012-06-30 MED ORDER — MELOXICAM 15 MG PO TABS: 15.0000 mg | ORAL_TABLET | Freq: Every day | ORAL | Status: DC

## 2012-06-30 MED ORDER — OMEPRAZOLE MAGNESIUM 20 MG PO TBEC: 20.0000 mg | DELAYED_RELEASE_TABLET | Freq: Every day | ORAL | Status: DC

## 2012-06-30 NOTE — Progress Notes (Signed)
UR Completed Brenda [name]-Bigelow, RN,BSN [phone]  

## 2012-06-30 NOTE — Progress Notes (Signed)
  Echocardiogram 2D Echocardiogram has been performed.  [name], Robyn FRANCES 06/30/2012, 11:27 AM

## 2012-06-30 NOTE — Care Management Note (Unsigned)
    Page 1 of 1   06/30/2012     8:08:57 AM   CARE MANAGEMENT NOTE 06/30/2012  Patient:  Robyn [name]   Account Number:  [id number]  Date Initiated:  06/30/2012  Documentation initiated by:  [name]-BIGELOW,BRENDA  Subjective/Objective Assessment:   Pt admitted with cp.     Action/Plan:   CM will continue ot monitor for disposition needs.   Anticipated DC Date:  07/01/2012   Anticipated DC Plan:  HOME/SELF CARE      DC Planning Services  CM consult      Choice offered to / List presented to:             Status of service:  In process, will continue to follow Medicare Important Message given?   (If response is "NO", the following Medicare IM given date fields will be blank) Date Medicare IM given:   Date Additional Medicare IM given:    Discharge Disposition:    Per UR Regulation:  Reviewed for med. necessity/level of care/duration of stay  If discussed at Long Length of Stay Meetings, dates discussed:    Comments:

## 2012-06-30 NOTE — Progress Notes (Signed)
DC orders received.  Patient stable with no S/S of distress.  Medication and discharge information reviewed with patient and patient's husband.  Patient DC home with husband. [place], Jessica Marie  

## 2012-06-30 NOTE — Progress Notes (Signed)
Subjective: The patient describes pain which is in the left lateral chest and radiates around under her left breast. It is described as sharp. The pain is worse with deep breath and also with changing of positions especially lying onto her left side. It is not exertional. No shortness of breath. It is not worse when she is sitting up or lying down. Today while getting the echocardiogram the transducer pressure on her chest caused soreness  Objective: Vital signs in last 24 hours: Temp:  [98 F (36.7 C)-98.7 F (37.1 C)] 98 F (36.7 C) (03/09 0600) Pulse Rate:  [79-92] 92 (03/09 0600) Resp:  [18-97] 18 (03/09 0600) BP: (137-151)/(61-73) 139/73 mmHg (03/09 0929) SpO2:  [97 %-100 %] 97 % (03/09 0600) Weight:  [88.4 kg (194 lb 14.2 oz)-89.1 kg (196 lb 6.9 oz)] 88.4 kg (194 lb 14.2 oz) (03/09 0600) Weight change:  Last BM Date: 06/28/12  Intake/Output from previous day: 03/08 0701 - 03/09 0700 In: 240 [P.O.:240] Out: 1750 [Urine:1750] Intake/Output this shift: Total I/O In: 240 [P.O.:240] Out: 1050 [Urine:1050]  General appearance: alert and cooperative Resp: clear to auscultation bilaterally Chest wall: no tenderness, Tender left lateral chest wall Cardio: regular rate and rhythm, S1, S2 normal, no murmur, click, rub or gallop  Lab Results:  Recent Labs  06/29/12 1140 06/29/12 1615  WBC 9.9 10.0  HGB 12.7 12.3  HCT 36.5 35.6*  PLT 248 224   BMET  Recent Labs  06/29/12 1140 06/29/12 1615 06/30/12 0320  NA 140  --  138  K 3.9  --  3.9  CL 101  --  102  CO2 28  --  26  GLUCOSE 141*  --  122*  BUN 16  --  14  CREATININE 0.91 0.83 0.92  CALCIUM 10.4  --  9.0    Studies/Results: Dg Chest 2 View  06/29/2012  *RADIOLOGY REPORT*  Clinical Data: Chest pain  CHEST - 2 VIEW  Comparison: CTA chest dated 06/10/2010  Findings: Low lung volumes.  No focal consolidation.  No pleural effusion or pneumothorax.  The heart is top normal in size for inspiration.  Degenerative  changes of the visualized thoracolumbar spine.  IMPRESSION: No evidence of acute cardiopulmonary disease.   Original Report Authenticated By: Charline Bills, M.D.     Medications: I have reviewed the patient's current medications.  Assessment/Plan: Chest pain, chest wall in origin. D-dimer normal, EKG normal and exam unremarkable, doubt pericarditis or pulmonary embolus. Would be very atypical for cardiac ischemia her reflux. We'll discharge her today and treat with a short course of anti-inflammatories and pain medication   LOS: 1 day   GRIFFIN,JOHN JOSEPH 06/30/2012, 1:15 PM

## 2012-06-30 NOTE — Discharge Summary (Signed)
Physician Discharge Summary  Patient ID: Robyn [name] MRN: [id number] DOB/AGE: 1946/03/11 67 y.o.  Admit date: 06/29/2012 Discharge date: 06/30/2012  Admission Diagnoses: Chest pain, pleuritic Diabetes mellitus Hypertension  Discharge Diagnoses:  Principal Problem:  Chest wall pain Active Problems:   Diabetes mellitus type 2 without retinopathy   Hypertension   Discharged Condition: good  Hospital Course: The patient was admitted on March 8 complaining of left-sided chest pain starting in her left lateral chest area and radiating under her left breast. The pain was sharp. It was worse when she took a deep breath or lie on the left side. It was not worse with lying down. There is no shortness of breath. In ER chest x-ray was unremarkable. D-dimer was normal. EKG unremarkable. An echocardiogram was done and was pending at the time of this dictation. Based on her exam and history we felt the pain was likely chest wall in origin and we will treat this with a short course of nonsteroidals and pain medication.  Consults: None  Significant Diagnostic Studies: labs: Troponin less than 0.3, BUN and creatinine 14.92, LFTs normal, d-dimer 0.5: CXR: normal  Treatments: analgesia: Vicodin  Discharge Exam: Blood pressure 139/73, pulse 92, temperature 98 F (36.7 C), temperature source Oral, resp. rate 18, height 5\' 3"  (1.6 m), weight 88.4 kg (194 lb 14.2 oz), SpO2 97.00%. General appearance: alert and cooperative Resp: clear to auscultation bilaterally Chest wall: no tenderness, Tender left chest wall Cardio: regular rate and rhythm, S1, S2 normal, no murmur, click, rub or gallop  Disposition: 01-Home or Self Care     Medication List    TAKE these medications       aspirin EC 81 MG tablet  Take 81 mg by mouth daily.     glimepiride 4 MG tablet  Commonly known as:  AMARYL  Take 4 mg by mouth daily before breakfast.     glyBURIDE 5 MG tablet  Commonly known as:  DIABETA  Take 5  mg by mouth 2 (two) times daily with a meal.     HYDROcodone-acetaminophen 5-325 MG per tablet  Commonly known as:  NORCO/VICODIN  Take 1 tablet by mouth 2 (two) times daily as needed for pain.     lisinopril-hydrochlorothiazide 20-25 MG per tablet  Commonly known as:  PRINZIDE,ZESTORETIC  Take 1 tablet by mouth daily.     meloxicam 15 MG tablet  Commonly known as:  MOBIC  Take 1 tablet (15 mg total) by mouth daily.     metFORMIN 1000 MG tablet  Commonly known as:  GLUCOPHAGE  Take 1,000 mg by mouth 2 (two) times daily with a meal.     omeprazole 20 MG tablet  Commonly known as:  PRILOSEC OTC  Take 1 tablet (20 mg total) by mouth daily.     rosuvastatin 20 MG tablet  Commonly known as:  CRESTOR  Take 20 mg by mouth daily.     VITAMIN B-12 PO  Take 1 tablet by mouth daily.     VITAMIN D PO  Take 1 capsule by mouth daily.     VITAMIN E PO  Take 1 capsule by mouth daily.           Follow-up Information   Follow up with GATES,ROBERT NEVILL, MD.   Contact information:   [address] [place] [address] [phone]       Signed: Lillia Mountain 06/30/2012, 1:24 PM

## 2012-07-02 LAB — FOLATE RBC: RBC Folate: 2194 ng/mL — ABNORMAL HIGH (ref >=366)

## 2013-05-15 ENCOUNTER — Other Ambulatory Visit: Payer: Self-pay

## 2013-05-15 DIAGNOSIS — Z1231 Encounter for screening mammogram for malignant neoplasm of breast: Secondary | ICD-10-CM

## 2013-06-19 ENCOUNTER — Inpatient Hospital Stay: Admission: RE | Admit: 2013-06-19 | Payer: Medicare Other | Source: Ambulatory Visit

## 2013-07-02 ENCOUNTER — Ambulatory Visit
Admission: RE | Admit: 2013-07-02 | Discharge: 2013-07-02 | Disposition: A | Discharge status: Home / Self Care | Payer: Medicare Other | Source: Ambulatory Visit

## 2013-07-02 DIAGNOSIS — Z1231 Encounter for screening mammogram for malignant neoplasm of breast: Secondary | ICD-10-CM

## 2014-07-07 ENCOUNTER — Other Ambulatory Visit: Payer: Self-pay

## 2014-07-07 DIAGNOSIS — Z1231 Encounter for screening mammogram for malignant neoplasm of breast: Secondary | ICD-10-CM

## 2014-07-20 ENCOUNTER — Ambulatory Visit
Admission: RE | Admit: 2014-07-20 | Discharge: 2014-07-20 | Disposition: A | Discharge status: Home / Self Care | Payer: Medicare Other | Source: Ambulatory Visit

## 2014-07-20 DIAGNOSIS — Z1231 Encounter for screening mammogram for malignant neoplasm of breast: Secondary | ICD-10-CM

## 2015-06-24 ENCOUNTER — Other Ambulatory Visit: Payer: Self-pay

## 2015-06-24 DIAGNOSIS — Z1231 Encounter for screening mammogram for malignant neoplasm of breast: Secondary | ICD-10-CM

## 2015-07-22 ENCOUNTER — Ambulatory Visit
Admission: RE | Admit: 2015-07-22 | Discharge: 2015-07-22 | Disposition: A | Discharge status: Home / Self Care | Payer: Medicare Other | Source: Ambulatory Visit

## 2015-07-22 DIAGNOSIS — Z1231 Encounter for screening mammogram for malignant neoplasm of breast: Secondary | ICD-10-CM

## 2015-08-09 DIAGNOSIS — Z1389 Encounter for screening for other disorder: Secondary | ICD-10-CM | POA: Diagnosis not present

## 2015-08-09 DIAGNOSIS — E782 Mixed hyperlipidemia: Secondary | ICD-10-CM | POA: Diagnosis not present

## 2015-08-09 DIAGNOSIS — Z7984 Long term (current) use of oral hypoglycemic drugs: Secondary | ICD-10-CM | POA: Diagnosis not present

## 2015-08-09 DIAGNOSIS — Z0001 Encounter for general adult medical examination with abnormal findings: Secondary | ICD-10-CM | POA: Diagnosis not present

## 2015-08-09 DIAGNOSIS — M545 Low back pain: Secondary | ICD-10-CM | POA: Diagnosis not present

## 2015-08-09 DIAGNOSIS — E1140 Type 2 diabetes mellitus with diabetic neuropathy, unspecified: Secondary | ICD-10-CM | POA: Diagnosis not present

## 2015-08-09 DIAGNOSIS — K521 Toxic gastroenteritis and colitis: Secondary | ICD-10-CM | POA: Diagnosis not present

## 2015-08-09 DIAGNOSIS — I10 Essential (primary) hypertension: Secondary | ICD-10-CM | POA: Diagnosis not present

## 2015-08-09 DIAGNOSIS — Z79899 Other long term (current) drug therapy: Secondary | ICD-10-CM | POA: Diagnosis not present

## 2015-09-01 DIAGNOSIS — K521 Toxic gastroenteritis and colitis: Secondary | ICD-10-CM | POA: Diagnosis not present

## 2015-09-01 DIAGNOSIS — Z7984 Long term (current) use of oral hypoglycemic drugs: Secondary | ICD-10-CM | POA: Diagnosis not present

## 2015-09-01 DIAGNOSIS — E1140 Type 2 diabetes mellitus with diabetic neuropathy, unspecified: Secondary | ICD-10-CM | POA: Diagnosis not present

## 2015-11-26 DIAGNOSIS — Z7984 Long term (current) use of oral hypoglycemic drugs: Secondary | ICD-10-CM | POA: Diagnosis not present

## 2015-11-26 DIAGNOSIS — I10 Essential (primary) hypertension: Secondary | ICD-10-CM | POA: Diagnosis not present

## 2015-11-26 DIAGNOSIS — E782 Mixed hyperlipidemia: Secondary | ICD-10-CM | POA: Diagnosis not present

## 2015-11-26 DIAGNOSIS — Z79899 Other long term (current) drug therapy: Secondary | ICD-10-CM | POA: Diagnosis not present

## 2015-11-26 DIAGNOSIS — E1140 Type 2 diabetes mellitus with diabetic neuropathy, unspecified: Secondary | ICD-10-CM | POA: Diagnosis not present

## 2015-11-26 DIAGNOSIS — M545 Low back pain: Secondary | ICD-10-CM | POA: Diagnosis not present

## 2015-11-26 DIAGNOSIS — K521 Toxic gastroenteritis and colitis: Secondary | ICD-10-CM | POA: Diagnosis not present

## 2016-05-08 DIAGNOSIS — Z7984 Long term (current) use of oral hypoglycemic drugs: Secondary | ICD-10-CM | POA: Diagnosis not present

## 2016-05-08 DIAGNOSIS — M545 Low back pain: Secondary | ICD-10-CM | POA: Diagnosis not present

## 2016-05-08 DIAGNOSIS — I10 Essential (primary) hypertension: Secondary | ICD-10-CM | POA: Diagnosis not present

## 2016-05-08 DIAGNOSIS — E1140 Type 2 diabetes mellitus with diabetic neuropathy, unspecified: Secondary | ICD-10-CM | POA: Diagnosis not present

## 2016-05-08 DIAGNOSIS — E782 Mixed hyperlipidemia: Secondary | ICD-10-CM | POA: Diagnosis not present

## 2016-05-08 DIAGNOSIS — R143 Flatulence: Secondary | ICD-10-CM | POA: Diagnosis not present

## 2016-05-08 DIAGNOSIS — Z79899 Other long term (current) drug therapy: Secondary | ICD-10-CM | POA: Diagnosis not present

## 2016-05-29 DIAGNOSIS — J0110 Acute frontal sinusitis, unspecified: Secondary | ICD-10-CM | POA: Diagnosis not present

## 2016-08-12 DIAGNOSIS — H524 Presbyopia: Secondary | ICD-10-CM | POA: Diagnosis not present

## 2016-12-07 ENCOUNTER — Other Ambulatory Visit: Payer: Self-pay | Admitting: Internal Medicine

## 2016-12-07 DIAGNOSIS — Z1231 Encounter for screening mammogram for malignant neoplasm of breast: Secondary | ICD-10-CM

## 2016-12-19 ENCOUNTER — Ambulatory Visit: Payer: Medicare Other

## 2016-12-27 ENCOUNTER — Ambulatory Visit
Admission: RE | Admit: 2016-12-27 | Discharge: 2016-12-27 | Disposition: A | Discharge status: Home / Self Care | Payer: Medicare Other | Source: Ambulatory Visit | Attending: Internal Medicine | Admitting: Internal Medicine

## 2016-12-27 DIAGNOSIS — Z1231 Encounter for screening mammogram for malignant neoplasm of breast: Secondary | ICD-10-CM

## 2016-12-29 DIAGNOSIS — K521 Toxic gastroenteritis and colitis: Secondary | ICD-10-CM | POA: Diagnosis not present

## 2016-12-29 DIAGNOSIS — Z1389 Encounter for screening for other disorder: Secondary | ICD-10-CM | POA: Diagnosis not present

## 2016-12-29 DIAGNOSIS — E1140 Type 2 diabetes mellitus with diabetic neuropathy, unspecified: Secondary | ICD-10-CM | POA: Diagnosis not present

## 2016-12-29 DIAGNOSIS — Z0001 Encounter for general adult medical examination with abnormal findings: Secondary | ICD-10-CM | POA: Diagnosis not present

## 2016-12-29 DIAGNOSIS — E782 Mixed hyperlipidemia: Secondary | ICD-10-CM | POA: Diagnosis not present

## 2017-04-16 ENCOUNTER — Encounter (HOSPITAL_COMMUNITY): Payer: Self-pay | Admitting: Emergency Medicine

## 2017-04-16 ENCOUNTER — Other Ambulatory Visit: Payer: Self-pay

## 2017-04-16 ENCOUNTER — Emergency Department (HOSPITAL_COMMUNITY)
Admission: EM | Admit: 2017-04-16 | Discharge: 2017-04-17 | Disposition: A | Discharge status: Home / Self Care | Payer: Medicare Other | Attending: Emergency Medicine | Admitting: Emergency Medicine

## 2017-04-16 DIAGNOSIS — Z885 Allergy status to narcotic agent status: Secondary | ICD-10-CM | POA: Insufficient documentation

## 2017-04-16 DIAGNOSIS — M25561 Pain in right knee: Secondary | ICD-10-CM | POA: Insufficient documentation

## 2017-04-16 DIAGNOSIS — E113299 Type 2 diabetes mellitus with mild nonproliferative diabetic retinopathy without macular edema, unspecified eye: Secondary | ICD-10-CM | POA: Diagnosis not present

## 2017-04-16 DIAGNOSIS — I10 Essential (primary) hypertension: Secondary | ICD-10-CM | POA: Diagnosis not present

## 2017-04-16 DIAGNOSIS — Z79899 Other long term (current) drug therapy: Secondary | ICD-10-CM | POA: Diagnosis not present

## 2017-04-16 DIAGNOSIS — Z7984 Long term (current) use of oral hypoglycemic drugs: Secondary | ICD-10-CM | POA: Insufficient documentation

## 2017-04-16 DIAGNOSIS — Z7982 Long term (current) use of aspirin: Secondary | ICD-10-CM | POA: Diagnosis not present

## 2017-04-16 NOTE — ED Notes (Signed)
Bed: WA04 Expected date:  Expected time:  Means of arrival:  Comments: Level 4 only 

## 2017-04-17 ENCOUNTER — Emergency Department (HOSPITAL_COMMUNITY): Payer: Medicare Other

## 2017-04-17 DIAGNOSIS — M25561 Pain in right knee: Secondary | ICD-10-CM | POA: Diagnosis not present

## 2017-04-17 MED ORDER — TRAMADOL HCL 50 MG PO TABS
50.0000 mg | ORAL_TABLET | Freq: Once | ORAL | Status: AC
  Administered 2017-04-17: 50 mg via ORAL
  Filled 2017-04-17: qty 1

## 2017-04-17 NOTE — Discharge Instructions (Signed)
Please read instructions below. Apply ice to your knee for 20 minutes at a time. Elevate it when possible. Wear the knee brace at all times. You can take the prescription for the walker to any medical supply store. You can take your hydrocodone as prescribed as needed for pain. Schedule an appointment with the Orthopedic specialist in 1 week for follow-up on your injury. Return to the ER for new or concerning symptoms.

## 2017-04-17 NOTE — ED Notes (Signed)
Pt states she was walking up steps when she felt a pop in her right knee. Pt states she is now no longer able to bear weight.

## 2017-04-17 NOTE — ED Provider Notes (Signed)
[facility]-EMERGENCY DEPT Provider Note   CSN: [id number] Arrival date & time: 04/16/17  2303     History   Chief Complaint Chief Complaint  Patient presents with  . Knee Pain    HPI Robyn [name] is a 71 y.o. female w PMHx DM, HTN, arthritis, presenting to ED with acute onset of right knee pain that occurred PTA.  Patient states she was stepping up on a step and felt immediate pain to the posterior aspect of her right knee with a "popping" sound.  Pain is worse with any ambulation or movement.  No medications tried prior to arrival.  She denies any fall or other injuries.  No previous injuries to right knee, however she does states she has arthritis in this knee.  The history is provided by the patient.    Past Medical History:  Diagnosis Date  . Arthritis   . Back pain   . Diabetes mellitus without complication (HCC)   . Heart murmur   . Hypertension     Patient Active Problem List   Diagnosis Date Noted  . Pleuritic chest pain 06/29/2012  . Diabetes mellitus type 2 without retinopathy (HCC) 06/29/2012  . Hypertension 06/29/2012    Past Surgical History:  Procedure Laterality Date  . ABDOMINAL HYSTERECTOMY    . BACK SURGERY     L4-L5  . TONSILLECTOMY      OB History    No data available       Home Medications    Prior to Admission medications   Medication Sig Start Date End Date Taking? Authorizing Provider  aspirin EC 81 MG tablet Take 81 mg by mouth daily.    [provider]  Cholecalciferol (VITAMIN D PO) Take 1 capsule by mouth daily.    [provider]  Cyanocobalamin (VITAMIN B-12 PO) Take 1 tablet by mouth daily.    [provider]  glimepiride (AMARYL) 4 MG tablet Take 4 mg by mouth daily before breakfast.    [provider]  glyBURIDE (DIABETA) 5 MG tablet Take 5 mg by mouth 2 (two) times daily with a meal.    [provider]  HYDROcodone-acetaminophen (NORCO/VICODIN) 5-325  MG per tablet Take 1 tablet by mouth 2 (two) times daily as needed for pain.    [provider]  lisinopril-hydrochlorothiazide (PRINZIDE,ZESTORETIC) 20-25 MG per tablet Take 1 tablet by mouth daily.    [provider]  meloxicam (MOBIC) 15 MG tablet Take 1 tablet (15 mg total) by mouth daily. 06/30/12   Kirby Funk, MD  omeprazole (PRILOSEC OTC) 20 MG tablet Take 1 tablet (20 mg total) by mouth daily. 06/30/12   Kirby Funk, MD  rosuvastatin (CRESTOR) 20 MG tablet Take 20 mg by mouth daily.    [provider]  VITAMIN E PO Take 1 capsule by mouth daily.    [provider]    Family History Family History  Problem Relation Age of Onset  . Alzheimer's disease Mother   . Heart attack Father   . Breast cancer Neg Hx     Social History Social History   Tobacco Use  . Smoking status: Never Smoker  . Smokeless tobacco: Never Used  Substance Use Topics  . Alcohol use: Yes    Alcohol/week: 1.8 oz    Types: 2 Glasses of wine, 1 Shots of liquor per week    Comment: social drinker  . Drug use: No     Allergies   Metformin and related;  Codeine; and Lipitor [atorvastatin]   Review of Systems Review of Systems  Musculoskeletal: Positive for arthralgias. Negative for joint swelling.  Skin: Negative for wound.  All other systems reviewed and are negative.    Physical Exam Updated Vital Signs BP 135/71 (BP Location: Right Arm)   Pulse 86   Temp 97.6 F (36.4 C) (Oral)   Resp 12   Ht 5\' 3"  (1.6 m)   Wt 80.7 kg (178 lb)   SpO2 99%   BMI 31.53 kg/m   Physical Exam  Constitutional: She appears well-developed and well-nourished. No distress.  HENT:  Head: Normocephalic and atraumatic.  Eyes: Conjunctivae are normal.  Cardiovascular: Normal rate and intact distal pulses.  Pulmonary/Chest: Effort normal.  Abdominal: Soft.  Musculoskeletal:  Posterior aspect of right knee with tenderness.  Pain with passive flexion of right knee and with  anterior drawer test; however both anterior and posterior drawer test without laxity.  Negative valgus and varus.  Knee is stable.  No edema or deformity noted.  No erythema or warmth noted.  Neurological: She is alert.  Skin: Skin is warm.  Psychiatric: She has a normal mood and affect. Her behavior is normal.  Nursing note and vitals reviewed.    ED Treatments / Results  Labs (all labs ordered are listed, but only abnormal results are displayed) Labs Reviewed - No data to display  EKG  EKG Interpretation None       Radiology Dg Knee Complete 4 Views Right  Result Date: 04/17/2017 CLINICAL DATA:  Right knee pain after twisting injury EXAM: RIGHT KNEE - COMPLETE 4+ VIEW COMPARISON:  None. FINDINGS: No evidence of fracture or dislocation. Small joint effusion. No evidence of arthropathy or other focal bone abnormality. There is a well corticated fragment adjacent to the lateral femoral condyle mL, likely chronic. Soft tissues are unremarkable. IMPRESSION: Small joint effusion without acute fracture or dislocation of the right knee. Electronically Signed   By: Deatra RobinsonKevin  Herman M.D.   On: 04/17/2017 01:09    Procedures Procedures (including critical care time)  Medications Ordered in ED Medications  traMADol (ULTRAM) tablet 50 mg (50 mg Oral Given 04/17/17 0045)     Initial Impression / Assessment and Plan / ED Course  I have reviewed the triage vital signs and the nursing notes.  Pertinent labs & imaging results that were available during my care of the patient were reviewed by me and considered in my medical decision making (see chart for details).     Pt with acute R knee pain. Pain with ROM, knee is stable on exam. Patient X-Ray negative for obvious fracture or dislocation. Pain managed in ED. Pt advised to follow up with orthopedics for possibility of internal derangement. Patient given immobilizer brace and crutches while in ED, rx for walker also provided. Conservative  therapy recommended and discussed. Ortho referral given. Patient will be dc home & is agreeable with above plan.  Patient discussed with and seen by Dr. Preston FleetingGlick.  [facility] queried. Pt prescribed 30 day supply of hydrocodone, filled on 04/06/17. Pt instructed to take this as prescribed for pain.  Discussed results, findings, treatment and follow up. Patient advised of return precautions. Patient verbalized understanding and agreed with plan.  Final Clinical Impressions(s) / ED Diagnoses   Final diagnoses:  Acute pain of right knee    ED Discharge Orders    None       Chen Saadeh, [place] N, PA-C 04/17/17 0115    Preston FleetingGlick,  Onalee Huaavid, MD 04/17/17 [phone]

## 2017-04-26 ENCOUNTER — Telehealth (INDEPENDENT_AMBULATORY_CARE_PROVIDER_SITE_OTHER): Payer: Self-pay | Admitting: Orthopaedic Surgery

## 2017-04-26 NOTE — Telephone Encounter (Signed)
Returned call to patient left message to call back. 

## 2017-04-30 ENCOUNTER — Ambulatory Visit (INDEPENDENT_AMBULATORY_CARE_PROVIDER_SITE_OTHER): Payer: Self-pay

## 2017-04-30 ENCOUNTER — Telehealth (INDEPENDENT_AMBULATORY_CARE_PROVIDER_SITE_OTHER): Payer: Self-pay | Admitting: Radiology

## 2017-04-30 ENCOUNTER — Encounter (INDEPENDENT_AMBULATORY_CARE_PROVIDER_SITE_OTHER): Payer: Self-pay | Admitting: Orthopaedic Surgery

## 2017-04-30 ENCOUNTER — Ambulatory Visit (INDEPENDENT_AMBULATORY_CARE_PROVIDER_SITE_OTHER): Payer: Medicare Other | Admitting: Orthopaedic Surgery

## 2017-04-30 DIAGNOSIS — M25562 Pain in left knee: Secondary | ICD-10-CM

## 2017-04-30 DIAGNOSIS — M25561 Pain in right knee: Secondary | ICD-10-CM | POA: Diagnosis not present

## 2017-04-30 DIAGNOSIS — G8929 Other chronic pain: Secondary | ICD-10-CM

## 2017-04-30 DIAGNOSIS — M170 Bilateral primary osteoarthritis of knee: Secondary | ICD-10-CM

## 2017-04-30 MED ORDER — TRAMADOL HCL 50 MG PO TABS: 50.0000 mg | ORAL_TABLET | Freq: Three times a day (TID) | ORAL | 2 refills | Status: DC | PRN

## 2017-04-30 NOTE — Progress Notes (Signed)
Office Visit Note   Patient: Robyn [name]           Date of Birth: 11/18/1945           MRN: [id number] Visit Date: 04/30/2017              Requested by: Robyn NobleGates, Robert, MD [address] [place], [place] [address] PCP: Robyn NobleGates, Robert, MD   Assessment & Plan: Visit Diagnoses:  1. Bilateral primary osteoarthritis of knee   2. Chronic pain of left knee   3. Chronic pain of right knee   4. Acute pain of right knee     Plan:  Impression is 72 year old female with advanced degenerative joint disease of the left knee and right knee effusion concerning for acute meniscal tear.  In regards to the left knee I reviewed the x-rays with the patient which shows end-stage arthritis.  We discussed nonoperative versus operative treatment in detail.  Sounds like she has exhausted conservative treatment at this point and she may be leaning towards a left knee replacement.  In regards to the right knee given the history and the effusion the concern is for meniscal tear therefore we will obtain an MRI of the right knee.  Follow-up afterwards.  Patient did decline cortisone injection today. Total face to face encounter time was greater than 45 minutes and over half of this time was spent in counseling and/or coordination of care.  Follow-Up Instructions: Return in about 10 days (around 05/10/2017) for review MRI knee.   Orders:  Orders Placed This Encounter  Procedures  . XR Knee Complete 4 Views Left  . XR Knee Complete 4 Views Right  . MR Knee Right w/o contrast   Meds ordered this encounter  Medications  . traMADol (ULTRAM) 50 MG tablet    Sig: Take 1-2 tablets (50-100 mg total) by mouth 3 (three) times daily as needed.    Dispense:  30 tablet    Refill:  2      Procedures: No procedures performed   Clinical Data: No additional findings.   Subjective: Chief Complaint  Patient presents with  . Right Knee - Pain    HPI this is a pleasant 72 year old patient who  presents to our clinic today with bilateral knee pain right greater than left.  This is been ongoing for the past several years.  No known injury or change in activity.  In regards to the right knee she states that on 04/16/2017 she was walking up the stairs when all of a sudden felt a sharp pain and popping sensation to the posterior aspect.  She was unable to bear weight.  [facility] where x-rays were obtained.  Negative for fracture or other acute findings.  Since then she has had a constant ache to the posterior medial aspect of her right knee.  Minimal pain in the morning but progressively worsens throughout the day.  No locking or catching.  Pain is worse with flexion and increased activity.  She is unable take NSAIDs due to these causing mouth ulcers.  No radicular symptoms noted.  In regards to the left knee, she has a history of what sounds like a meniscus tear for which was arthroscopically debrided several years back.  She had a flareup of symptoms this past September and was injected with cortisone by her primary care doctor, Robyn [name].  She states that shot helped but did not last very long.  He describes a  constant ache to this knee as well.  Review of Systems as detailed in HPI all others reviewed and are negative.   Objective: Vital Signs: There were no vitals taken for this visit.  Physical Exam well-developed well-nourished female no acute distress.  Alert and oriented x3.  Ortho Exam examination of both knees reveals range of motion from 0-115 degrees no effusion.  Medial joint line tenderness both sides.  Minimal patellofemoral crepitus.  She is nervous intact distally.  Ligaments are stable.  Right knee exam reveals a small joint effusion.  Negative McMurray.   Specialty Comments:  No specialty comments available.  Imaging: Xr Knee Complete 4 Views Left  Result Date: 04/30/2017 Advanced degenerative joint disease with no medial compartment joint space  Xr Knee  Complete 4 Views Right  Result Date: 04/30/2017 Mild degenerative joint disease with mild joint space narrowing    PMFS History: Patient Active Problem List   Diagnosis Date Noted  . Bilateral primary osteoarthritis of knee 04/30/2017  . Pleuritic chest pain 06/29/2012  . Diabetes mellitus type 2 without retinopathy (HCC) 06/29/2012  . Hypertension 06/29/2012   Past Medical History:  Diagnosis Date  . Arthritis   . Back pain   . Diabetes mellitus without complication (HCC)   . Heart murmur   . Hypertension     Family History  Problem Relation Age of Onset  . Alzheimer's disease Mother   . Heart attack Father   . Breast cancer Neg Hx     Past Surgical History:  Procedure Laterality Date  . ABDOMINAL HYSTERECTOMY    . BACK SURGERY     L4-L5  . TONSILLECTOMY     Social History   Occupational History  . Not on file  Tobacco Use  . Smoking status: Never Smoker  . Smokeless tobacco: Never Used  Substance and Sexual Activity  . Alcohol use: Yes    Alcohol/week: 1.8 oz    Types: 2 Glasses of wine, 1 Shots of liquor per week    Comment: social drinker  . Drug use: No  . Sexual activity: No

## 2017-04-30 NOTE — Telephone Encounter (Signed)
Pharmacy called and said that patient gets monthly Rx hydrocodone from Dr Marden Nobleobert Gates.  Per Mardella LaymanLindsey do not fill tramadol Rx.  Pharmacy advised.

## 2017-05-12 ENCOUNTER — Ambulatory Visit
Admission: RE | Admit: 2017-05-12 | Discharge: 2017-05-12 | Disposition: A | Discharge status: Home / Self Care | Payer: Medicare Other | Source: Ambulatory Visit | Attending: Orthopaedic Surgery | Admitting: Orthopaedic Surgery

## 2017-05-12 DIAGNOSIS — M25561 Pain in right knee: Secondary | ICD-10-CM

## 2017-05-12 DIAGNOSIS — M25461 Effusion, right knee: Secondary | ICD-10-CM | POA: Diagnosis not present

## 2017-05-13 NOTE — Progress Notes (Signed)
MRI

## 2017-05-14 ENCOUNTER — Ambulatory Visit (INDEPENDENT_AMBULATORY_CARE_PROVIDER_SITE_OTHER): Payer: Medicare Other | Admitting: Orthopaedic Surgery

## 2017-05-14 ENCOUNTER — Encounter (INDEPENDENT_AMBULATORY_CARE_PROVIDER_SITE_OTHER): Payer: Self-pay | Admitting: Orthopaedic Surgery

## 2017-05-14 DIAGNOSIS — M170 Bilateral primary osteoarthritis of knee: Secondary | ICD-10-CM

## 2017-05-14 MED ORDER — METHYLPREDNISOLONE ACETATE 40 MG/ML IJ SUSP
40.0000 mg | INTRAMUSCULAR | Status: AC | PRN
  Administered 2017-05-14: 40 mg via INTRA_ARTICULAR

## 2017-05-14 MED ORDER — LIDOCAINE HCL 1 % IJ SOLN
2.0000 mL | INTRAMUSCULAR | Status: AC | PRN
  Administered 2017-05-14: 2 mL

## 2017-05-14 MED ORDER — BUPIVACAINE HCL 0.5 % IJ SOLN
2.0000 mL | INTRAMUSCULAR | Status: AC | PRN
  Administered 2017-05-14: 2 mL via INTRA_ARTICULAR

## 2017-05-14 NOTE — Progress Notes (Signed)
Office Visit Note   Patient: Robyn [name]           Date of Birth: 21-May-1945           MRN: [id number] Visit Date: 05/14/2017              Requested by: Marden Noble, MD [address] [place], [place] [address] PCP: Marden Noble, MD   Assessment & Plan: Visit Diagnoses:  1. Bilateral primary osteoarthritis of knee     Plan: Impression is bilateral knee osteoarthritis bone-on-bone on the left.  We will submit application for Synvisc for her left knee.  Right knee was injected with cortisone today.  Her MRI findings are consistent with mild osteoarthritis in her medial and patellofemoral compartment.  No acute findings.  Follow-Up Instructions: Return if symptoms worsen or fail to improve.   Orders:  No orders of the defined types were placed in this encounter.  No orders of the defined types were placed in this encounter.     Procedures: Large Joint Inj: R knee on 05/14/2017 11:43 AM Indications: pain Details: 22 G needle  Arthrogram: No  Medications: 40 mg methylPREDNISolone acetate 40 MG/ML; 2 mL lidocaine 1 %; 2 mL bupivacaine 0.5 % Consent was given by the patient. Patient was prepped and draped in the usual sterile fashion.       Clinical Data: No additional findings.   Subjective: Chief Complaint  Patient presents with  . Right Knee - Pain    Patient comes back today to review her MRI.  Her left knee continues to bother her.  She has tried cortisone injections without any significant relief.    Review of Systems  Constitutional: Negative.   HENT: Negative.   Eyes: Negative.   Respiratory: Negative.   Cardiovascular: Negative.   Endocrine: Negative.   Musculoskeletal: Negative.   Neurological: Negative.   Hematological: Negative.   Psychiatric/Behavioral: Negative.   All other systems reviewed and are negative.    Objective: Vital Signs: There were no vitals taken for this visit.  Physical Exam  Constitutional: She is  oriented to person, place, and time. She appears well-developed and well-nourished.  Pulmonary/Chest: Effort normal.  Neurological: She is alert and oriented to person, place, and time.  Skin: Skin is warm. Capillary refill takes less than 2 seconds.  Psychiatric: She has a normal mood and affect. Her behavior is normal. Judgment and thought content normal.  Nursing note and vitals reviewed.   Ortho Exam Exam is stable Specialty Comments:  No specialty comments available.  Imaging: No results found.   PMFS History: Patient Active Problem List   Diagnosis Date Noted  . Bilateral primary osteoarthritis of knee 04/30/2017  . Pleuritic chest pain 06/29/2012  . Diabetes mellitus type 2 without retinopathy (HCC) 06/29/2012  . Hypertension 06/29/2012   Past Medical History:  Diagnosis Date  . Arthritis   . Back pain   . Diabetes mellitus without complication (HCC)   . Heart murmur   . Hypertension     Family History  Problem Relation Age of Onset  . Alzheimer's disease Mother   . Heart attack Father   . Breast cancer Neg Hx     Past Surgical History:  Procedure Laterality Date  . ABDOMINAL HYSTERECTOMY    . BACK SURGERY     L4-L5  . TONSILLECTOMY     Social History   Occupational History  . Not on file  Tobacco Use  . Smoking status: Never  Smoker  . Smokeless tobacco: Never Used  Substance and Sexual Activity  . Alcohol use: Yes    Alcohol/week: 1.8 oz    Types: 2 Glasses of wine, 1 Shots of liquor per week    Comment: social drinker  . Drug use: No  . Sexual activity: No

## 2017-05-16 ENCOUNTER — Telehealth (INDEPENDENT_AMBULATORY_CARE_PROVIDER_SITE_OTHER): Payer: Self-pay

## 2017-05-16 NOTE — Telephone Encounter (Signed)
SYNVISC INJECTION CX:    Called patient to advise Injection for Synvisc was approved and she was only responsible for 20% and we can [place]. She states she does not want to have gel injection anymore. She states her knee is "Bone on Bone" and that this injection will not help. She would like to cancel the application.

## 2017-05-18 DIAGNOSIS — M170 Bilateral primary osteoarthritis of knee: Secondary | ICD-10-CM | POA: Diagnosis not present

## 2017-05-18 DIAGNOSIS — I10 Essential (primary) hypertension: Secondary | ICD-10-CM | POA: Diagnosis not present

## 2017-05-18 DIAGNOSIS — M545 Low back pain: Secondary | ICD-10-CM | POA: Diagnosis not present

## 2017-06-05 ENCOUNTER — Encounter (INDEPENDENT_AMBULATORY_CARE_PROVIDER_SITE_OTHER): Payer: Self-pay | Admitting: Orthopaedic Surgery

## 2017-06-05 ENCOUNTER — Ambulatory Visit (INDEPENDENT_AMBULATORY_CARE_PROVIDER_SITE_OTHER): Payer: Medicare Other | Admitting: Orthopaedic Surgery

## 2017-06-05 ENCOUNTER — Telehealth (INDEPENDENT_AMBULATORY_CARE_PROVIDER_SITE_OTHER): Payer: Self-pay

## 2017-06-05 DIAGNOSIS — M1712 Unilateral primary osteoarthritis, left knee: Secondary | ICD-10-CM | POA: Diagnosis not present

## 2017-06-05 MED ORDER — METHYLPREDNISOLONE ACETATE 40 MG/ML IJ SUSP
40.0000 mg | INTRAMUSCULAR | Status: AC | PRN
  Administered 2017-06-05: 40 mg via INTRA_ARTICULAR

## 2017-06-05 MED ORDER — BUPIVACAINE HCL 0.25 % IJ SOLN
2.0000 mL | INTRAMUSCULAR | Status: AC | PRN
  Administered 2017-06-05: 2 mL via INTRA_ARTICULAR

## 2017-06-05 MED ORDER — LIDOCAINE HCL 1 % IJ SOLN
2.0000 mL | INTRAMUSCULAR | Status: AC | PRN
  Administered 2017-06-05: 2 mL

## 2017-06-05 NOTE — Progress Notes (Signed)
   Procedure Note  Patient: Robyn [name]             Date of Birth: 05/11/1945           MRN: [id number]             Visit Date: 06/05/2017  Procedures: Visit Diagnoses: No diagnosis found.  Large Joint Inj: L knee on 06/05/2017 5:02 PM Indications: pain Details: 22 G needle, anterolateral approach Medications: 2 mL lidocaine 1 %; 2 mL bupivacaine 0.25 %; 40 mg methylPREDNISolone acetate 40 MG/ML

## 2017-06-05 NOTE — Telephone Encounter (Signed)
Please submit application in for patient for Synvisc injection.  Right knee  Synvisc  DR Sharee PimpleXu [employer]

## 2017-06-05 NOTE — Telephone Encounter (Signed)
Patient would like to get Gel Injection in Left knee today. Changed her mind and would like to get it done today.  Okay per wendy to go ahead and get inj(synvisc). Patient will be responsible for 20 % patient aware  SYNVISC INJ. BUY AND BILL. LEFT KNEE.   We will submit for right knee inj

## 2017-06-06 NOTE — Telephone Encounter (Signed)
Submitted SynviscOne application online for right knee 

## 2017-06-11 ENCOUNTER — Telehealth (INDEPENDENT_AMBULATORY_CARE_PROVIDER_SITE_OTHER): Payer: Self-pay

## 2017-06-11 NOTE — Telephone Encounter (Signed)
Talked with patient and advised her that she is approved for Right Knee injection, SynviscOne.  [facility].  Patient will have a co-pay of $40.00 and be responsible for 20% which could be an estimate of $300.00-$350.00 OOP.  Appt.scheduled for 06/19/17

## 2017-06-19 ENCOUNTER — Encounter (INDEPENDENT_AMBULATORY_CARE_PROVIDER_SITE_OTHER): Payer: Self-pay | Admitting: Orthopaedic Surgery

## 2017-06-19 ENCOUNTER — Ambulatory Visit (INDEPENDENT_AMBULATORY_CARE_PROVIDER_SITE_OTHER): Payer: Medicare Other | Admitting: Orthopaedic Surgery

## 2017-06-19 DIAGNOSIS — M170 Bilateral primary osteoarthritis of knee: Secondary | ICD-10-CM

## 2017-06-19 NOTE — Progress Notes (Signed)
   Procedure Note  Patient: Robyn [name]             Date of Birth: 05/23/1945           MRN: [id number]             Visit Date: 06/19/2017  Procedures: Visit Diagnoses: Bilateral primary osteoarthritis of knee  Large Joint Inj: R knee on 06/19/2017 4:00 PM Indications: pain Details: 22 G needle, anterolateral approach

## 2017-07-03 DIAGNOSIS — I10 Essential (primary) hypertension: Secondary | ICD-10-CM | POA: Diagnosis not present

## 2017-07-03 DIAGNOSIS — E1140 Type 2 diabetes mellitus with diabetic neuropathy, unspecified: Secondary | ICD-10-CM | POA: Diagnosis not present

## 2017-07-03 DIAGNOSIS — M545 Low back pain: Secondary | ICD-10-CM | POA: Diagnosis not present

## 2017-07-03 DIAGNOSIS — M170 Bilateral primary osteoarthritis of knee: Secondary | ICD-10-CM | POA: Diagnosis not present

## 2017-08-07 DIAGNOSIS — E1140 Type 2 diabetes mellitus with diabetic neuropathy, unspecified: Secondary | ICD-10-CM | POA: Diagnosis not present

## 2017-08-07 DIAGNOSIS — Z7984 Long term (current) use of oral hypoglycemic drugs: Secondary | ICD-10-CM | POA: Diagnosis not present

## 2017-08-07 DIAGNOSIS — M545 Low back pain: Secondary | ICD-10-CM | POA: Diagnosis not present

## 2017-10-03 ENCOUNTER — Encounter (INDEPENDENT_AMBULATORY_CARE_PROVIDER_SITE_OTHER): Payer: Self-pay | Admitting: Orthopaedic Surgery

## 2017-10-03 ENCOUNTER — Ambulatory Visit (INDEPENDENT_AMBULATORY_CARE_PROVIDER_SITE_OTHER): Payer: Medicare Other | Admitting: Orthopaedic Surgery

## 2017-10-03 DIAGNOSIS — M1712 Unilateral primary osteoarthritis, left knee: Secondary | ICD-10-CM

## 2017-10-03 DIAGNOSIS — E119 Type 2 diabetes mellitus without complications: Secondary | ICD-10-CM | POA: Diagnosis not present

## 2017-10-03 MED ORDER — DICLOFENAC SODIUM 1 % TD GEL: 2.0000 g | Freq: Four times a day (QID) | TRANSDERMAL | 5 refills | Status: AC

## 2017-10-03 NOTE — Progress Notes (Signed)
Office Visit Note   Patient: Robyn [name]           Date of Birth: 07/31/1945           MRN: [id number] Visit Date: 10/03/2017              Requested by: Marden NobleGates, Robert, MD [address] [place], [place] [address] PCP: Marden NobleGates, Robert, MD   Assessment & Plan: Visit Diagnoses:  1. Unilateral primary osteoarthritis, left knee   2. Diabetes mellitus type 2 without retinopathy (HCC)     Plan: Impression 72 year old female with end-stage degenerative joint disease left knee with failure of conservative treatment.  At this point patient wishes to pursue a total knee replacement.  However her hemoglobin A1c 3 months ago was 9.7.  We discussed that this needs to be been healthy 8.0 before we can proceed with a total knee replacement.  She understands this.  She is supposed to have a follow-up A1c in about a week.  She will give us a call and let us know the results  Follow-Up Instructions: Return if symptoms worsen or fail to improve.   Orders:  No orders of the defined types were placed in this encounter.  Meds ordered this encounter  Medications  . diclofenac sodium (VOLTAREN) 1 % GEL    Sig: Apply 2 g topically 4 (four) times daily.    Dispense:  1 Tube    Refill:  5      Procedures: No procedures performed   Clinical Data: No additional findings.   Subjective: Chief Complaint  Patient presents with  . Left Knee - Pain    Ms. [name] follows up today for continued left knee pain.  She states the previous HA injection gave her only a couple days of relief.  She has constant pain throughout the day and night with tingling and burning.  She has failed conservative treatment.  She denies any numbness or tingling.  She is not able to be as active as she wants.  She feels that she has no quality of life.   Review of Systems  Constitutional: Negative.   HENT: Negative.   Eyes: Negative.   Respiratory: Negative.   Cardiovascular: Negative.   Endocrine:  Negative.   Musculoskeletal: Negative.   Neurological: Negative.   Hematological: Negative.   Psychiatric/Behavioral: Negative.   All other systems reviewed and are negative.    Objective: Vital Signs: There were no vitals taken for this visit.  Physical Exam  Constitutional: She is oriented to person, place, and time. She appears well-developed and well-nourished.  Pulmonary/Chest: Effort normal.  Neurological: She is alert and oriented to person, place, and time.  Skin: Skin is warm. Capillary refill takes less than 2 seconds.  Psychiatric: She has a normal mood and affect. Her behavior is normal. Judgment and thought content normal.  Nursing note and vitals reviewed.   Ortho Exam Left knee exam shows no joint effusion.  Collaterals and cruciates are stable. Specialty Comments:  No specialty comments available.  Imaging: No results found.   PMFS History: Patient Active Problem List   Diagnosis Date Noted  . Unilateral primary osteoarthritis, left knee 10/03/2017  . Bilateral primary osteoarthritis of knee 04/30/2017  . Pleuritic chest pain 06/29/2012  . Diabetes mellitus type 2 without retinopathy (HCC) 06/29/2012  . Hypertension 06/29/2012   Past Medical History:  Diagnosis Date  . Arthritis   . Back pain   . Diabetes mellitus without complication (HCC)   .  Heart murmur   . Hypertension     Family History  Problem Relation Age of Onset  . Alzheimer's disease Mother   . Heart attack Father   . Breast cancer Neg Hx     Past Surgical History:  Procedure Laterality Date  . ABDOMINAL HYSTERECTOMY    . BACK SURGERY     L4-L5  . TONSILLECTOMY     Social History   Occupational History  . Not on file  Tobacco Use  . Smoking status: Never Smoker  . Smokeless tobacco: Never Used  Substance and Sexual Activity  . Alcohol use: Yes    Alcohol/week: 1.8 oz    Types: 2 Glasses of wine, 1 Shots of liquor per week    Comment: social drinker  . Drug use: No  .  Sexual activity: Never

## 2017-10-09 DIAGNOSIS — I10 Essential (primary) hypertension: Secondary | ICD-10-CM | POA: Diagnosis not present

## 2017-10-09 DIAGNOSIS — M545 Low back pain: Secondary | ICD-10-CM | POA: Diagnosis not present

## 2017-10-09 DIAGNOSIS — M170 Bilateral primary osteoarthritis of knee: Secondary | ICD-10-CM | POA: Diagnosis not present

## 2017-10-09 DIAGNOSIS — E1140 Type 2 diabetes mellitus with diabetic neuropathy, unspecified: Secondary | ICD-10-CM | POA: Diagnosis not present

## 2017-10-29 ENCOUNTER — Telehealth (INDEPENDENT_AMBULATORY_CARE_PROVIDER_SITE_OTHER): Payer: Self-pay | Admitting: Orthopaedic Surgery

## 2017-10-29 NOTE — Telephone Encounter (Signed)
Patient called stating that her pharmacy faxed over a form requesting authorization for the gel cream to be covered by her insurance.  Please advise.  [phone].  Thank you.

## 2017-10-30 NOTE — Telephone Encounter (Signed)
DID PA THROUGH COVER MY [email or web address]

## 2017-10-30 NOTE — Telephone Encounter (Signed)
PENDING

## 2017-10-31 ENCOUNTER — Telehealth (INDEPENDENT_AMBULATORY_CARE_PROVIDER_SITE_OTHER): Payer: Self-pay | Admitting: Orthopaedic Surgery

## 2017-10-31 NOTE — Telephone Encounter (Signed)
Steward DroneBrenda from [employer] called needing a call back concerning form that was incomplete for the Diclofenac Gel. The number to contact Steward DroneBrenda is [phone] opt [phone]

## 2017-11-01 ENCOUNTER — Telehealth (INDEPENDENT_AMBULATORY_CARE_PROVIDER_SITE_OTHER): Payer: Self-pay

## 2017-11-01 NOTE — Telephone Encounter (Signed)
Synetta FailAnita with [facility] [employer] [facility] [employer] called stating that Diclofenac Gel was denied.  If you have any questions please call [phone], opt.#5.  Thank You.

## 2018-01-09 DIAGNOSIS — Z0000 Encounter for general adult medical examination without abnormal findings: Secondary | ICD-10-CM | POA: Diagnosis not present

## 2018-01-09 DIAGNOSIS — M545 Low back pain: Secondary | ICD-10-CM | POA: Diagnosis not present

## 2018-01-09 DIAGNOSIS — Z23 Encounter for immunization: Secondary | ICD-10-CM | POA: Diagnosis not present

## 2018-01-09 DIAGNOSIS — Z1389 Encounter for screening for other disorder: Secondary | ICD-10-CM | POA: Diagnosis not present

## 2018-01-22 ENCOUNTER — Other Ambulatory Visit: Payer: Self-pay | Admitting: Internal Medicine

## 2018-01-22 DIAGNOSIS — Z1231 Encounter for screening mammogram for malignant neoplasm of breast: Secondary | ICD-10-CM

## 2018-01-23 DIAGNOSIS — R197 Diarrhea, unspecified: Secondary | ICD-10-CM | POA: Diagnosis not present

## 2018-01-23 DIAGNOSIS — R143 Flatulence: Secondary | ICD-10-CM | POA: Diagnosis not present

## 2018-01-23 DIAGNOSIS — R159 Full incontinence of feces: Secondary | ICD-10-CM | POA: Diagnosis not present

## 2018-01-23 DIAGNOSIS — N393 Stress incontinence (female) (male): Secondary | ICD-10-CM | POA: Diagnosis not present

## 2018-02-08 DIAGNOSIS — Z86010 Personal history of colonic polyps: Secondary | ICD-10-CM | POA: Diagnosis not present

## 2018-02-08 DIAGNOSIS — R1314 Dysphagia, pharyngoesophageal phase: Secondary | ICD-10-CM | POA: Diagnosis not present

## 2018-02-08 DIAGNOSIS — R197 Diarrhea, unspecified: Secondary | ICD-10-CM | POA: Diagnosis not present

## 2018-02-11 DIAGNOSIS — R151 Fecal smearing: Secondary | ICD-10-CM | POA: Diagnosis not present

## 2018-02-11 DIAGNOSIS — M6289 Other specified disorders of muscle: Secondary | ICD-10-CM | POA: Diagnosis not present

## 2018-02-11 DIAGNOSIS — M6281 Muscle weakness (generalized): Secondary | ICD-10-CM | POA: Diagnosis not present

## 2018-02-11 DIAGNOSIS — N3946 Mixed incontinence: Secondary | ICD-10-CM | POA: Diagnosis not present

## 2018-02-18 DIAGNOSIS — M62838 Other muscle spasm: Secondary | ICD-10-CM | POA: Diagnosis not present

## 2018-02-18 DIAGNOSIS — N3946 Mixed incontinence: Secondary | ICD-10-CM | POA: Diagnosis not present

## 2018-02-18 DIAGNOSIS — M6281 Muscle weakness (generalized): Secondary | ICD-10-CM | POA: Diagnosis not present

## 2018-02-18 DIAGNOSIS — M6289 Other specified disorders of muscle: Secondary | ICD-10-CM | POA: Diagnosis not present

## 2018-02-20 ENCOUNTER — Ambulatory Visit
Admission: RE | Admit: 2018-02-20 | Discharge: 2018-02-20 | Disposition: A | Discharge status: Home / Self Care | Payer: Medicare Other | Source: Ambulatory Visit | Attending: Internal Medicine | Admitting: Internal Medicine

## 2018-02-20 DIAGNOSIS — Z1231 Encounter for screening mammogram for malignant neoplasm of breast: Secondary | ICD-10-CM | POA: Diagnosis not present

## 2018-02-22 DIAGNOSIS — R197 Diarrhea, unspecified: Secondary | ICD-10-CM | POA: Diagnosis not present

## 2018-02-22 DIAGNOSIS — R1310 Dysphagia, unspecified: Secondary | ICD-10-CM | POA: Diagnosis not present

## 2018-02-22 DIAGNOSIS — K2930 Chronic superficial gastritis without bleeding: Secondary | ICD-10-CM | POA: Diagnosis not present

## 2018-02-22 DIAGNOSIS — K2980 Duodenitis without bleeding: Secondary | ICD-10-CM | POA: Diagnosis not present

## 2018-02-22 DIAGNOSIS — K635 Polyp of colon: Secondary | ICD-10-CM | POA: Diagnosis not present

## 2018-02-28 DIAGNOSIS — K2930 Chronic superficial gastritis without bleeding: Secondary | ICD-10-CM | POA: Diagnosis not present

## 2018-02-28 DIAGNOSIS — K635 Polyp of colon: Secondary | ICD-10-CM | POA: Diagnosis not present

## 2018-02-28 DIAGNOSIS — K2980 Duodenitis without bleeding: Secondary | ICD-10-CM | POA: Diagnosis not present

## 2018-03-04 DIAGNOSIS — M6281 Muscle weakness (generalized): Secondary | ICD-10-CM | POA: Diagnosis not present

## 2018-03-04 DIAGNOSIS — R151 Fecal smearing: Secondary | ICD-10-CM | POA: Diagnosis not present

## 2018-03-04 DIAGNOSIS — M6289 Other specified disorders of muscle: Secondary | ICD-10-CM | POA: Diagnosis not present

## 2018-03-04 DIAGNOSIS — M62838 Other muscle spasm: Secondary | ICD-10-CM | POA: Diagnosis not present

## 2018-03-27 DIAGNOSIS — M62838 Other muscle spasm: Secondary | ICD-10-CM | POA: Diagnosis not present

## 2018-03-27 DIAGNOSIS — R151 Fecal smearing: Secondary | ICD-10-CM | POA: Diagnosis not present

## 2018-03-27 DIAGNOSIS — M6281 Muscle weakness (generalized): Secondary | ICD-10-CM | POA: Diagnosis not present

## 2018-03-27 DIAGNOSIS — M6289 Other specified disorders of muscle: Secondary | ICD-10-CM | POA: Diagnosis not present

## 2018-04-16 NOTE — Pre-Procedure Instructions (Addendum)
Robyn [name]  04/16/2018      [facility] - [place], [place] - [address] [place] [address] Phone: [phone] Fax: [phone]    Your procedure is scheduled on Mon., Jan. 6, 2020 from 10:04AM-12:08PM  Report to [facility] Ad[facility] "A" at 8:00AM  Call this number if you have problems the morning of surgery:  [phone]   Remember:  Do not eat or drink after midnight on Jan. 5th    Take these medicines the morning of surgery with A SIP OF WATER: AmLODipine (NORVASC), Pantoprazole (PROTONIX), and TraMADol (ULTRAM)  7 days before surgery (04/22/18), stop taking all Other Aspirin Products, Vitamins, Fish oils, and Herbal medications. Also stop all NSAIDS i.e. Advil, Ibuprofen, Motrin, Aleve, Anaprox, Naproxen, BC, Goody Powders, and all Supplements.   WHAT DO I DO ABOUT MY DIABETES MEDICATION?  Marland Kitchen. Do not take Glimepiride (AMARYL) the night before and the morning of surgery.  How to Manage Your Diabetes Before and After Surgery  Why is it important to control my blood sugar before and after surgery? . Improving blood sugar levels before and after surgery helps healing and can limit problems. . A way of improving blood sugar control is eating a healthy diet by: o  Eating less sugar and carbohydrates o  Increasing activity/exercise o  Talking with your doctor about reaching your blood sugar goals . High blood sugars (greater than 180 mg/dL) can raise your risk of infections and slow your recovery, so you will need to focus on controlling your diabetes during the weeks before surgery. . Make sure that the doctor who takes care of your diabetes knows about your planned surgery including the date and location.  How do I manage my blood sugar before surgery? . Check your blood sugar at least 4 times a day, starting 2 days before surgery, to make sure that the level is not too high or low. o Check  your blood sugar the morning of your surgery when you wake up and every 2 hours until you get to the Short Stay unit. . If your blood sugar is less than 70 mg/dL, you will need to treat for low blood sugar: o Do not take insulin. o Treat a low blood sugar (less than 70 mg/dL) with  cup of clear juice (cranberry or apple), 4 glucose tablets, OR glucose gel. Recheck blood sugar in 15 minutes after treatment (to make sure it is greater than 70 mg/dL). If your blood sugar is not greater than 70 mg/dL on recheck, call [phone] o  for further instructions. . If your CBG is greater than 220 mg/dL, you may take  of your sliding scale (correction) dose of insulin.  . If you are admitted to the hospital after surgery: o Your blood sugar will be checked by the staff and you will probably be given insulin after surgery (instead of oral diabetes medicines) to make sure you have good blood sugar levels. o The goal for blood sugar control after surgery is 80-180 mg/dL.  Reviewed and Endorsed by [facility] Patient Education Committee, August 2015    Do not wear jewelry, make-up or nail polish.  Do not wear lotions, powders, or perfumes, or deodorant.  Do not shave 48 hours prior to surgery.    Do not bring valuables to the hospital.  [facility] is not responsible for any belongings or valuables.  Contacts, dentures or bridgework may not be  worn into surgery.  Leave your suitcase in the car.  After surgery it may be brought to your room.  For patients admitted to the hospital, discharge time will be determined by your treatment team.  Patients discharged the day of surgery will not be allowed to drive home.   Special instructions:   [place]- Preparing For Surgery  Before surgery, you can play an important role. Because skin is not sterile, your skin needs to be as free of germs as possible. You can reduce the number of germs on your skin by washing with CHG (chlorahexidine gluconate) Soap  before surgery.  CHG is an antiseptic cleaner which kills germs and bonds with the skin to continue killing germs even after washing.    Oral Hygiene is also important to reduce your risk of infection.  Remember - BRUSH YOUR TEETH THE MORNING OF SURGERY WITH YOUR REGULAR TOOTHPASTE  Please do not use if you have an allergy to CHG or antibacterial soaps. If your skin becomes reddened/irritated stop using the CHG.  Do not shave (including legs and underarms) for at least 48 hours prior to first CHG shower. It is OK to shave your face.  Please follow these instructions carefully.   1. Shower the NIGHT BEFORE SURGERY and the MORNING OF SURGERY with CHG.   2. If you chose to wash your hair, wash your hair first as usual with your normal shampoo.  3. After you shampoo, rinse your hair and body thoroughly to remove the shampoo.  4. Use CHG as you would any other liquid soap. You can apply CHG directly to the skin and wash gently with a scrungie or a clean washcloth.   5. Apply the CHG Soap to your body ONLY FROM THE NECK DOWN.  Do not use on open wounds or open sores. Avoid contact with your eyes, ears, mouth and genitals (private parts). Wash Face and genitals (private parts)  with your normal soap.  6. Wash thoroughly, paying special attention to the area where your surgery will be performed.  7. Thoroughly rinse your body with warm water from the neck down.  8. DO NOT shower/wash with your normal soap after using and rinsing off the CHG Soap.  9. Pat yourself dry with a CLEAN TOWEL.  10. Wear CLEAN PAJAMAS to bed the night before surgery, wear comfortable clothes the morning of surgery  11. Place CLEAN SHEETS on your bed the night of your first shower and DO NOT SLEEP WITH PETS.  Day of Surgery:  Do not apply any deodorants/lotions.  Please wear clean clothes to the hospital/surgery center.   Remember to brush your teeth WITH YOUR REGULAR TOOTHPASTE.  Please read over the following  fact sheets that you were given. Pain Booklet, Coughing and Deep Breathing, MRSA Information and Surgical Site Infection Prevention

## 2018-04-18 ENCOUNTER — Other Ambulatory Visit: Payer: Self-pay

## 2018-04-18 ENCOUNTER — Encounter (HOSPITAL_COMMUNITY): Payer: Self-pay

## 2018-04-18 ENCOUNTER — Ambulatory Visit (HOSPITAL_COMMUNITY)
Admission: RE | Admit: 2018-04-18 | Discharge: 2018-04-18 | Disposition: A | Discharge status: Home / Self Care | Payer: Medicare Other | Source: Ambulatory Visit | Attending: Orthopaedic Surgery | Admitting: Orthopaedic Surgery

## 2018-04-18 ENCOUNTER — Encounter (HOSPITAL_COMMUNITY)
Admission: RE | Admit: 2018-04-18 | Discharge: 2018-04-18 | Disposition: A | Discharge status: Home / Self Care | Payer: Medicare Other | Source: Ambulatory Visit | Attending: Orthopaedic Surgery | Admitting: Orthopaedic Surgery

## 2018-04-18 DIAGNOSIS — M1712 Unilateral primary osteoarthritis, left knee: Secondary | ICD-10-CM | POA: Diagnosis not present

## 2018-04-18 DIAGNOSIS — Z01818 Encounter for other preprocedural examination: Secondary | ICD-10-CM | POA: Diagnosis not present

## 2018-04-18 LAB — CBC WITH DIFFERENTIAL/PLATELET
Abs Immature Granulocytes: 0.03 10*3/uL (ref 0.00–0.07)
BASOS ABS: 0.1 10*3/uL (ref 0.0–0.1)
Basophils Relative: 1 %
Eosinophils Absolute: 0.2 10*3/uL (ref 0.0–0.5)
Eosinophils Relative: 3 %
HCT: 36.9 % (ref 36.0–46.0)
Hemoglobin: 12.5 g/dL (ref 12.0–15.0)
IMMATURE GRANULOCYTES: 0 %
Lymphocytes Relative: 41 %
Lymphs Abs: 3.3 10*3/uL (ref 0.7–4.0)
MCH: 31.3 pg (ref 26.0–34.0)
MCHC: 33.9 g/dL (ref 30.0–36.0)
MCV: 92.3 fL (ref 80.0–100.0)
Monocytes Absolute: 0.5 10*3/uL (ref 0.1–1.0)
Monocytes Relative: 7 %
NRBC: 0 % (ref 0.0–0.2)
Neutro Abs: 3.9 10*3/uL (ref 1.7–7.7)
Neutrophils Relative %: 48 %
Platelets: 270 10*3/uL (ref 150–400)
RBC: 4 MIL/uL (ref 3.87–5.11)
RDW: 11.9 % (ref 11.5–15.5)
WBC: 8 10*3/uL (ref 4.0–10.5)

## 2018-04-18 LAB — COMPREHENSIVE METABOLIC PANEL
ALBUMIN: 4.1 g/dL (ref 3.5–5.0)
ALT: 20 U/L (ref 0–44)
AST: 20 U/L (ref 15–41)
Alkaline Phosphatase: 47 U/L (ref 38–126)
Anion gap: 9 (ref 5–15)
BILIRUBIN TOTAL: 0.6 mg/dL (ref 0.3–1.2)
BUN: 28 mg/dL — AB (ref 8–23)
CO2: 25 mmol/L (ref 22–32)
Calcium: 9.5 mg/dL (ref 8.9–10.3)
Chloride: 105 mmol/L (ref 98–111)
Creatinine, Ser: 1.16 mg/dL — ABNORMAL HIGH (ref 0.44–1.00)
GFR calc Af Amer: 54 mL/min — ABNORMAL LOW (ref >60)
GFR, EST NON AFRICAN AMERICAN: 46 mL/min — AB (ref >60)
GLUCOSE: 193 mg/dL — AB (ref 70–99)
Potassium: 3.8 mmol/L (ref 3.5–5.1)
Sodium: 139 mmol/L (ref 135–145)
Total Protein: 7.2 g/dL (ref 6.5–8.1)

## 2018-04-18 LAB — SURGICAL PCR SCREEN
MRSA, PCR: NEGATIVE
Staphylococcus aureus: POSITIVE — AB

## 2018-04-18 LAB — PROTIME-INR
INR: 1.04
PROTHROMBIN TIME: 13.5 s (ref 11.4–15.2)

## 2018-04-18 LAB — APTT: aPTT: 32 seconds (ref 24–36)

## 2018-04-18 LAB — TYPE AND SCREEN
ABO/RH(D): A POS
Antibody Screen: NEGATIVE

## 2018-04-18 LAB — ABO/RH: ABO/RH(D): A POS

## 2018-04-18 LAB — HEMOGLOBIN A1C
Hgb A1c MFr Bld: 7.1 % — ABNORMAL HIGH (ref 4.8–5.6)
Mean Plasma Glucose: 157.07 mg/dL

## 2018-04-18 LAB — GLUCOSE, CAPILLARY: Glucose-Capillary: 210 mg/dL — ABNORMAL HIGH (ref 70–99)

## 2018-04-18 NOTE — Progress Notes (Addendum)
PCP - Dr. Marden Nobleobert Gates Cardiologist - Dr. Katrinka BlazingSmith  Chest x-ray - 04/18/18 EKG - 04/18/18 Stress Test - patient states she had stress test over 20 years ago. ECHO - 06/30/12 Cardiac Cath - denies  Sleep Study - denies  Patient with type 2 DM. Does not check her CBG's- states fasting CBG usually runs between 190-200.  Aspirin Instructions: patient instructed to hold all ASA/NSAID's/herbal medications/fish oil and vitamins 7 days prior to surgery.   Anesthesia review: yes- review EKG  Patient denies shortness of breath, fever, cough and chest pain at PAT appointment   Patient verbalized understanding of instructions that were given to them at the PAT appointment. Patient was also instructed that they will need to review over the PAT instructions again at home before surgery.

## 2018-04-18 NOTE — Progress Notes (Signed)
Surgical PCR +MSSA. Mupirocin called into [facility] [phone]. Patient notified and verbalized understanding.

## 2018-04-19 NOTE — Progress Notes (Signed)
Thank you :)

## 2018-04-26 MED ORDER — TRANEXAMIC ACID 1000 MG/10ML IV SOLN
2000.0000 mg | INTRAVENOUS | Status: AC
  Administered 2018-04-29: 2000 mg via TOPICAL
  Filled 2018-04-26: qty 20

## 2018-04-26 MED ORDER — TRANEXAMIC ACID-NACL 1000-0.7 MG/100ML-% IV SOLN
1000.0000 mg | INTRAVENOUS | Status: AC
  Administered 2018-04-29: 1000 mg via INTRAVENOUS
  Filled 2018-04-26: qty 100

## 2018-04-28 NOTE — Anesthesia Preprocedure Evaluation (Addendum)
Anesthesia Evaluation  Patient identified by MRN, date of birth, ID band Patient awake    Reviewed: Allergy & Precautions, NPO status , Patient's Chart, lab work & pertinent test results  Airway Mallampati: I       Dental  (+) Missing, Poor Dentition,    Pulmonary    Pulmonary exam normal breath sounds clear to auscultation       Cardiovascular hypertension, Pt. on medications Normal cardiovascular exam Rhythm:Regular Rate:Normal     Neuro/Psych    GI/Hepatic GERD  Medicated and Controlled,  Endo/Other  diabetes, Type 2, Oral Hypoglycemic Agents  Renal/GU      Musculoskeletal  (+) Arthritis , Osteoarthritis,    Abdominal Normal abdominal exam  (+)   Peds  Hematology negative hematology ROS (+)   Anesthesia Other Findings   Reproductive/Obstetrics                           Anesthesia Physical Anesthesia Plan  ASA: II  Anesthesia Plan: Spinal   Post-op Pain Management:  Regional for Post-op pain   Induction:   PONV Risk Score and Plan: 2 and Ondansetron and Treatment may vary due to age or medical condition  Airway Management Planned: Nasal Cannula, Natural Airway and Simple Face Mask  Additional Equipment:   Intra-op Plan:   Post-operative Plan:   Informed Consent: I have reviewed the patients History and Physical, chart, labs and discussed the procedure including the risks, benefits and alternatives for the proposed anesthesia with the patient or authorized representative who has indicated his/her understanding and acceptance.     Plan Discussed with: CRNA and Surgeon  Anesthesia Plan Comments:        Anesthesia Quick Evaluation

## 2018-04-29 ENCOUNTER — Ambulatory Visit (HOSPITAL_COMMUNITY): Payer: Medicare Other | Admitting: Physician Assistant

## 2018-04-29 ENCOUNTER — Ambulatory Visit (HOSPITAL_COMMUNITY): Payer: Medicare Other | Admitting: Certified Registered Nurse Anesthetist

## 2018-04-29 ENCOUNTER — Other Ambulatory Visit: Payer: Self-pay

## 2018-04-29 ENCOUNTER — Observation Stay (HOSPITAL_COMMUNITY)
Admission: RE | Admit: 2018-04-29 | Discharge: 2018-05-01 | Disposition: A | Discharge status: Home / Self Care | Payer: Medicare Other | Attending: Orthopaedic Surgery | Admitting: Orthopaedic Surgery

## 2018-04-29 ENCOUNTER — Observation Stay (HOSPITAL_COMMUNITY): Payer: Medicare Other

## 2018-04-29 ENCOUNTER — Encounter (HOSPITAL_COMMUNITY)
Admission: RE | Disposition: A | Discharge status: Home / Self Care | Payer: Self-pay | Source: Home / Self Care | Attending: Orthopaedic Surgery

## 2018-04-29 ENCOUNTER — Encounter (HOSPITAL_COMMUNITY): Payer: Self-pay | Admitting: *Deleted

## 2018-04-29 DIAGNOSIS — Z96652 Presence of left artificial knee joint: Secondary | ICD-10-CM

## 2018-04-29 DIAGNOSIS — E119 Type 2 diabetes mellitus without complications: Secondary | ICD-10-CM | POA: Insufficient documentation

## 2018-04-29 DIAGNOSIS — I2510 Atherosclerotic heart disease of native coronary artery without angina pectoris: Secondary | ICD-10-CM | POA: Diagnosis not present

## 2018-04-29 DIAGNOSIS — I110 Hypertensive heart disease with heart failure: Secondary | ICD-10-CM | POA: Insufficient documentation

## 2018-04-29 DIAGNOSIS — G8918 Other acute postprocedural pain: Secondary | ICD-10-CM | POA: Diagnosis not present

## 2018-04-29 DIAGNOSIS — Z7984 Long term (current) use of oral hypoglycemic drugs: Secondary | ICD-10-CM | POA: Insufficient documentation

## 2018-04-29 DIAGNOSIS — I509 Heart failure, unspecified: Secondary | ICD-10-CM | POA: Diagnosis not present

## 2018-04-29 DIAGNOSIS — J449 Chronic obstructive pulmonary disease, unspecified: Secondary | ICD-10-CM | POA: Diagnosis not present

## 2018-04-29 DIAGNOSIS — M1712 Unilateral primary osteoarthritis, left knee: Secondary | ICD-10-CM | POA: Diagnosis not present

## 2018-04-29 DIAGNOSIS — Z79899 Other long term (current) drug therapy: Secondary | ICD-10-CM | POA: Insufficient documentation

## 2018-04-29 DIAGNOSIS — Z791 Long term (current) use of non-steroidal anti-inflammatories (NSAID): Secondary | ICD-10-CM | POA: Diagnosis not present

## 2018-04-29 DIAGNOSIS — Z96659 Presence of unspecified artificial knee joint: Secondary | ICD-10-CM

## 2018-04-29 DIAGNOSIS — D62 Acute posthemorrhagic anemia: Secondary | ICD-10-CM | POA: Diagnosis not present

## 2018-04-29 DIAGNOSIS — Z471 Aftercare following joint replacement surgery: Secondary | ICD-10-CM | POA: Diagnosis not present

## 2018-04-29 HISTORY — PX: TOTAL KNEE ARTHROPLASTY: SHX125

## 2018-04-29 LAB — GLUCOSE, CAPILLARY
GLUCOSE-CAPILLARY: 210 mg/dL — AB (ref 70–99)
Glucose-Capillary: 260 mg/dL — ABNORMAL HIGH (ref 70–99)
Glucose-Capillary: 241 mg/dL — ABNORMAL HIGH (ref 70–99)
Glucose-Capillary: 138 mg/dL — ABNORMAL HIGH (ref 70–99)

## 2018-04-29 SURGERY — ARTHROPLASTY, KNEE, TOTAL
Anesthesia: Spinal | Site: Knee | Laterality: Left

## 2018-04-29 MED ORDER — OXYCODONE HCL ER 10 MG PO T12A
10.0000 mg | EXTENDED_RELEASE_TABLET | Freq: Two times a day (BID) | ORAL | Status: DC
  Administered 2018-04-29 – 2018-05-01 (×4): 10 mg via ORAL
  Filled 2018-04-29 (×4): qty 1

## 2018-04-29 MED ORDER — INSULIN ASPART 100 UNIT/ML ~~LOC~~ SOLN
SUBCUTANEOUS | Status: AC
  Filled 2018-04-29: qty 1

## 2018-04-29 MED ORDER — LISINOPRIL-HYDROCHLOROTHIAZIDE 20-25 MG PO TABS: 1.0000 | ORAL_TABLET | Freq: Every day | ORAL | Status: DC

## 2018-04-29 MED ORDER — HYDROMORPHONE HCL 1 MG/ML IJ SOLN
0.5000 mg | INTRAMUSCULAR | Status: DC | PRN
  Administered 2018-04-29: 0.5 mg via INTRAVENOUS
  Administered 2018-05-01: 1 mg via INTRAVENOUS
  Filled 2018-04-29: qty 1

## 2018-04-29 MED ORDER — VANCOMYCIN HCL 1000 MG IV SOLR
INTRAVENOUS | Status: AC
  Filled 2018-04-29: qty 1000

## 2018-04-29 MED ORDER — HYDROCHLOROTHIAZIDE 25 MG PO TABS
25.0000 mg | ORAL_TABLET | Freq: Every day | ORAL | Status: DC
  Administered 2018-04-30 – 2018-05-01 (×2): 25 mg via ORAL
  Filled 2018-04-29 (×2): qty 1

## 2018-04-29 MED ORDER — SODIUM CHLORIDE 0.9 % IV SOLN: INTRAVENOUS | Status: DC

## 2018-04-29 MED ORDER — CLONIDINE HCL (ANALGESIA) 100 MCG/ML EP SOLN
EPIDURAL | Status: DC | PRN
  Administered 2018-04-29: 100 ug

## 2018-04-29 MED ORDER — ROPIVACAINE HCL 5 MG/ML IJ SOLN
INTRAMUSCULAR | Status: DC | PRN
  Administered 2018-04-29 (×2): 5 mL via PERINEURAL

## 2018-04-29 MED ORDER — ROSUVASTATIN CALCIUM 20 MG PO TABS
20.0000 mg | ORAL_TABLET | Freq: Every day | ORAL | Status: DC
  Administered 2018-04-29 – 2018-05-01 (×3): 20 mg via ORAL
  Filled 2018-04-29 (×3): qty 1

## 2018-04-29 MED ORDER — ONDANSETRON HCL 4 MG/2ML IJ SOLN: 4.0000 mg | Freq: Four times a day (QID) | INTRAMUSCULAR | Status: DC | PRN

## 2018-04-29 MED ORDER — AMLODIPINE BESYLATE 5 MG PO TABS
5.0000 mg | ORAL_TABLET | Freq: Every day | ORAL | Status: DC
  Administered 2018-04-30 – 2018-05-01 (×2): 5 mg via ORAL
  Filled 2018-04-29 (×2): qty 1

## 2018-04-29 MED ORDER — SODIUM CHLORIDE 0.9 % IR SOLN
Status: DC | PRN
  Administered 2018-04-29: 3000 mL

## 2018-04-29 MED ORDER — METHOCARBAMOL 1000 MG/10ML IJ SOLN
500.0000 mg | Freq: Four times a day (QID) | INTRAVENOUS | Status: DC | PRN
  Filled 2018-04-29: qty 5

## 2018-04-29 MED ORDER — MIDAZOLAM HCL 2 MG/2ML IJ SOLN
INTRAMUSCULAR | Status: AC
  Filled 2018-04-29: qty 2

## 2018-04-29 MED ORDER — LACTATED RINGERS IV SOLN: INTRAVENOUS | Status: DC

## 2018-04-29 MED ORDER — ONDANSETRON HCL 4 MG PO TABS: 4.0000 mg | ORAL_TABLET | Freq: Three times a day (TID) | ORAL | 0 refills | Status: DC | PRN

## 2018-04-29 MED ORDER — LISINOPRIL 20 MG PO TABS
20.0000 mg | ORAL_TABLET | Freq: Every day | ORAL | Status: DC
  Administered 2018-05-01: 20 mg via ORAL
  Filled 2018-04-29 (×2): qty 1

## 2018-04-29 MED ORDER — ASPIRIN EC 81 MG PO TBEC: 81.0000 mg | DELAYED_RELEASE_TABLET | Freq: Two times a day (BID) | ORAL | 0 refills | Status: DC

## 2018-04-29 MED ORDER — KETOROLAC TROMETHAMINE 15 MG/ML IJ SOLN
INTRAMUSCULAR | Status: AC
  Administered 2018-04-29: 15 mg via INTRAVENOUS
  Filled 2018-04-29: qty 1

## 2018-04-29 MED ORDER — CEFAZOLIN SODIUM-DEXTROSE 2-4 GM/100ML-% IV SOLN
INTRAVENOUS | Status: AC
  Administered 2018-04-29: 2 g via INTRAVENOUS
  Filled 2018-04-29: qty 100

## 2018-04-29 MED ORDER — GLIMEPIRIDE 4 MG PO TABS
4.0000 mg | ORAL_TABLET | Freq: Two times a day (BID) | ORAL | Status: DC
  Administered 2018-04-29 – 2018-05-01 (×4): 4 mg via ORAL
  Filled 2018-04-29 (×5): qty 1

## 2018-04-29 MED ORDER — GABAPENTIN 300 MG PO CAPS
300.0000 mg | ORAL_CAPSULE | Freq: Three times a day (TID) | ORAL | Status: DC
  Administered 2018-04-29 – 2018-05-01 (×5): 300 mg via ORAL
  Filled 2018-04-29 (×2): qty 1
  Filled 2018-04-29: qty 3
  Filled 2018-04-29 (×2): qty 1

## 2018-04-29 MED ORDER — POLYETHYLENE GLYCOL 3350 17 G PO PACK: 17.0000 g | PACK | Freq: Every day | ORAL | Status: DC | PRN

## 2018-04-29 MED ORDER — METOCLOPRAMIDE HCL 5 MG PO TABS: 5.0000 mg | ORAL_TABLET | Freq: Three times a day (TID) | ORAL | Status: DC | PRN

## 2018-04-29 MED ORDER — CEFAZOLIN SODIUM-DEXTROSE 2-4 GM/100ML-% IV SOLN
2.0000 g | Freq: Four times a day (QID) | INTRAVENOUS | Status: AC
  Administered 2018-04-29 – 2018-04-30 (×3): 2 g via INTRAVENOUS
  Filled 2018-04-29 (×2): qty 100

## 2018-04-29 MED ORDER — SULFAMETHOXAZOLE-TRIMETHOPRIM 800-160 MG PO TABS: 1.0000 | ORAL_TABLET | Freq: Two times a day (BID) | ORAL | 0 refills | Status: DC

## 2018-04-29 MED ORDER — VANCOMYCIN HCL 1000 MG IV SOLR
INTRAVENOUS | Status: DC | PRN
  Administered 2018-04-29: 1000 mg via TOPICAL

## 2018-04-29 MED ORDER — MIDAZOLAM HCL 2 MG/2ML IJ SOLN
INTRAMUSCULAR | Status: AC
  Administered 2018-04-29: 2 mg via INTRAVENOUS
  Filled 2018-04-29: qty 2

## 2018-04-29 MED ORDER — PANTOPRAZOLE SODIUM 40 MG PO TBEC
40.0000 mg | DELAYED_RELEASE_TABLET | Freq: Every day | ORAL | Status: DC
  Administered 2018-04-30 – 2018-05-01 (×2): 40 mg via ORAL
  Filled 2018-04-29 (×2): qty 1

## 2018-04-29 MED ORDER — OXYCODONE HCL 5 MG PO TABS: 5.0000 mg | ORAL_TABLET | ORAL | 0 refills | Status: DC | PRN

## 2018-04-29 MED ORDER — METHOCARBAMOL 500 MG PO TABS
500.0000 mg | ORAL_TABLET | Freq: Four times a day (QID) | ORAL | Status: DC | PRN
  Administered 2018-04-29 – 2018-04-30 (×3): 500 mg via ORAL
  Filled 2018-04-29 (×2): qty 1

## 2018-04-29 MED ORDER — ROPIVACAINE HCL 7.5 MG/ML IJ SOLN
INTRAMUSCULAR | Status: DC | PRN
  Administered 2018-04-29 (×4): 5 mL via PERINEURAL

## 2018-04-29 MED ORDER — LIDOCAINE HCL (CARDIAC) PF 100 MG/5ML IV SOSY
PREFILLED_SYRINGE | INTRAVENOUS | Status: DC | PRN
  Administered 2018-04-29: 60 mg via INTRAVENOUS

## 2018-04-29 MED ORDER — METOCLOPRAMIDE HCL 5 MG/ML IJ SOLN: 5.0000 mg | Freq: Three times a day (TID) | INTRAMUSCULAR | Status: DC | PRN

## 2018-04-29 MED ORDER — ALUM & MAG HYDROXIDE-SIMETH 200-200-20 MG/5ML PO SUSP: 30.0000 mL | ORAL | Status: DC | PRN

## 2018-04-29 MED ORDER — PROPOFOL 500 MG/50ML IV EMUL
INTRAVENOUS | Status: DC | PRN
  Administered 2018-04-29: 75 ug/kg/min via INTRAVENOUS

## 2018-04-29 MED ORDER — FENTANYL CITRATE (PF) 100 MCG/2ML IJ SOLN
INTRAMUSCULAR | Status: AC
  Administered 2018-04-29: 100 ug via INTRAVENOUS
  Filled 2018-04-29: qty 2

## 2018-04-29 MED ORDER — MENTHOL 3 MG MT LOZG: 1.0000 | LOZENGE | OROMUCOSAL | Status: DC | PRN

## 2018-04-29 MED ORDER — ASPIRIN 81 MG PO CHEW
81.0000 mg | CHEWABLE_TABLET | Freq: Two times a day (BID) | ORAL | Status: DC
  Filled 2018-04-29 (×3): qty 1

## 2018-04-29 MED ORDER — ACETAMINOPHEN 325 MG PO TABS
325.0000 mg | ORAL_TABLET | Freq: Four times a day (QID) | ORAL | Status: DC | PRN
  Administered 2018-04-30: 325 mg via ORAL
  Filled 2018-04-29: qty 2

## 2018-04-29 MED ORDER — METHOCARBAMOL 750 MG PO TABS: 750.0000 mg | ORAL_TABLET | Freq: Two times a day (BID) | ORAL | 0 refills | Status: DC | PRN

## 2018-04-29 MED ORDER — 0.9 % SODIUM CHLORIDE (POUR BTL) OPTIME
TOPICAL | Status: DC | PRN
  Administered 2018-04-29: 1000 mL

## 2018-04-29 MED ORDER — HYDROMORPHONE HCL 1 MG/ML IJ SOLN
INTRAMUSCULAR | Status: AC
  Administered 2018-04-29: 0.5 mg via INTRAVENOUS
  Filled 2018-04-29: qty 1

## 2018-04-29 MED ORDER — PHENOL 1.4 % MT LIQD: 1.0000 | OROMUCOSAL | Status: DC | PRN

## 2018-04-29 MED ORDER — ONDANSETRON HCL 4 MG PO TABS: 4.0000 mg | ORAL_TABLET | Freq: Four times a day (QID) | ORAL | Status: DC | PRN

## 2018-04-29 MED ORDER — CHLORHEXIDINE GLUCONATE 4 % EX LIQD: 60.0000 mL | Freq: Once | CUTANEOUS | Status: DC

## 2018-04-29 MED ORDER — OXYCODONE HCL ER 10 MG PO T12A: 10.0000 mg | EXTENDED_RELEASE_TABLET | Freq: Two times a day (BID) | ORAL | 0 refills | Status: AC

## 2018-04-29 MED ORDER — INSULIN ASPART 100 UNIT/ML ~~LOC~~ SOLN
0.0000 [IU] | Freq: Every day | SUBCUTANEOUS | Status: DC
  Administered 2018-04-29: 2 [IU] via SUBCUTANEOUS
  Administered 2018-04-30: 4 [IU] via SUBCUTANEOUS
  Filled 2018-04-29: qty 0.05

## 2018-04-29 MED ORDER — OXYCODONE HCL 5 MG PO TABS
10.0000 mg | ORAL_TABLET | ORAL | Status: DC | PRN
  Administered 2018-04-30 (×2): 15 mg via ORAL
  Filled 2018-04-29: qty 2
  Filled 2018-04-29 (×2): qty 3

## 2018-04-29 MED ORDER — FENTANYL CITRATE (PF) 250 MCG/5ML IJ SOLN
INTRAMUSCULAR | Status: AC
  Filled 2018-04-29: qty 5

## 2018-04-29 MED ORDER — FENTANYL CITRATE (PF) 100 MCG/2ML IJ SOLN
100.0000 ug | Freq: Once | INTRAMUSCULAR | Status: AC
  Administered 2018-04-29: 100 ug via INTRAVENOUS

## 2018-04-29 MED ORDER — DIPHENHYDRAMINE HCL 12.5 MG/5ML PO ELIX: 25.0000 mg | ORAL_SOLUTION | ORAL | Status: DC | PRN

## 2018-04-29 MED ORDER — ONDANSETRON HCL 4 MG/2ML IJ SOLN
INTRAMUSCULAR | Status: DC | PRN
  Administered 2018-04-29: 4 mg via INTRAVENOUS

## 2018-04-29 MED ORDER — KETOROLAC TROMETHAMINE 15 MG/ML IJ SOLN
15.0000 mg | Freq: Once | INTRAMUSCULAR | Status: AC
  Administered 2018-04-29: 15 mg via INTRAVENOUS

## 2018-04-29 MED ORDER — SORBITOL 70 % SOLN: 30.0000 mL | Freq: Every day | Status: DC | PRN

## 2018-04-29 MED ORDER — PROMETHAZINE HCL 25 MG PO TABS: 25.0000 mg | ORAL_TABLET | Freq: Four times a day (QID) | ORAL | 1 refills | Status: DC | PRN

## 2018-04-29 MED ORDER — METHOCARBAMOL 500 MG PO TABS
ORAL_TABLET | ORAL | Status: AC
  Administered 2018-04-29: 500 mg via ORAL
  Filled 2018-04-29: qty 1

## 2018-04-29 MED ORDER — BUPIVACAINE IN DEXTROSE 0.75-8.25 % IT SOLN
INTRATHECAL | Status: DC | PRN
  Administered 2018-04-29: 1.6 mL via INTRATHECAL

## 2018-04-29 MED ORDER — CEFAZOLIN SODIUM-DEXTROSE 2-4 GM/100ML-% IV SOLN
2.0000 g | INTRAVENOUS | Status: AC
  Administered 2018-04-29: 2 g via INTRAVENOUS

## 2018-04-29 MED ORDER — OXYCODONE HCL 5 MG PO TABS
ORAL_TABLET | ORAL | Status: AC
  Administered 2018-04-29: 10 mg via ORAL
  Filled 2018-04-29: qty 2

## 2018-04-29 MED ORDER — DEXAMETHASONE SODIUM PHOSPHATE 10 MG/ML IJ SOLN
10.0000 mg | Freq: Once | INTRAMUSCULAR | Status: AC
  Administered 2018-04-30: 10 mg via INTRAVENOUS
  Filled 2018-04-29: qty 1

## 2018-04-29 MED ORDER — SENNOSIDES-DOCUSATE SODIUM 8.6-50 MG PO TABS: 1.0000 | ORAL_TABLET | Freq: Every evening | ORAL | 1 refills | Status: DC | PRN

## 2018-04-29 MED ORDER — SODIUM CHLORIDE 0.9% FLUSH
INTRAVENOUS | Status: DC | PRN
  Administered 2018-04-29: 40 mL via INTRAVENOUS

## 2018-04-29 MED ORDER — LACTATED RINGERS IV SOLN
INTRAVENOUS | Status: DC
  Administered 2018-04-29: 09:00:00 via INTRAVENOUS

## 2018-04-29 MED ORDER — MAGNESIUM CITRATE PO SOLN: 1.0000 | Freq: Once | ORAL | Status: DC | PRN

## 2018-04-29 MED ORDER — BUPIVACAINE LIPOSOME 1.3 % IJ SUSP
20.0000 mL | INTRAMUSCULAR | Status: AC
  Administered 2018-04-29: 20 mL
  Filled 2018-04-29: qty 20

## 2018-04-29 MED ORDER — CEFAZOLIN SODIUM-DEXTROSE 2-4 GM/100ML-% IV SOLN
INTRAVENOUS | Status: AC
  Filled 2018-04-29: qty 100

## 2018-04-29 MED ORDER — ONDANSETRON HCL 4 MG/2ML IJ SOLN: INTRAMUSCULAR | Status: DC | PRN

## 2018-04-29 MED ORDER — ACETAMINOPHEN 500 MG PO TABS
1000.0000 mg | ORAL_TABLET | Freq: Four times a day (QID) | ORAL | Status: AC
  Administered 2018-04-29 – 2018-04-30 (×4): 1000 mg via ORAL
  Filled 2018-04-29 (×4): qty 2

## 2018-04-29 MED ORDER — DOCUSATE SODIUM 100 MG PO CAPS
100.0000 mg | ORAL_CAPSULE | Freq: Two times a day (BID) | ORAL | Status: DC
  Administered 2018-04-29 – 2018-05-01 (×4): 100 mg via ORAL
  Filled 2018-04-29 (×4): qty 1

## 2018-04-29 MED ORDER — OXYCODONE HCL 5 MG PO TABS
5.0000 mg | ORAL_TABLET | ORAL | Status: DC | PRN
  Administered 2018-04-29 – 2018-05-01 (×4): 10 mg via ORAL
  Filled 2018-04-29: qty 2

## 2018-04-29 MED ORDER — INSULIN ASPART 100 UNIT/ML ~~LOC~~ SOLN
0.0000 [IU] | Freq: Three times a day (TID) | SUBCUTANEOUS | Status: DC
  Administered 2018-04-29: 5 [IU] via SUBCUTANEOUS
  Administered 2018-04-30: 11 [IU] via SUBCUTANEOUS
  Administered 2018-04-30: 5 [IU] via SUBCUTANEOUS
  Administered 2018-05-01: 2 [IU] via SUBCUTANEOUS
  Administered 2018-05-01: 5 [IU] via SUBCUTANEOUS
  Filled 2018-04-29: qty 0.15

## 2018-04-29 MED ORDER — MIDAZOLAM HCL 2 MG/2ML IJ SOLN
2.0000 mg | Freq: Once | INTRAMUSCULAR | Status: AC
  Administered 2018-04-29: 2 mg via INTRAVENOUS

## 2018-04-29 MED ORDER — TRANEXAMIC ACID-NACL 1000-0.7 MG/100ML-% IV SOLN
1000.0000 mg | Freq: Once | INTRAVENOUS | Status: AC
  Administered 2018-04-29: 1000 mg via INTRAVENOUS
  Filled 2018-04-29: qty 100

## 2018-04-29 MED ORDER — SODIUM CHLORIDE 0.9 % IV SOLN
INTRAVENOUS | Status: DC | PRN
  Administered 2018-04-29: 25 ug/min via INTRAVENOUS

## 2018-04-29 SURGICAL SUPPLY — 82 items
ALCOHOL ISOPROPYL (RUBBING) (MISCELLANEOUS) ×2 IMPLANT
BAG DECANTER FOR FLEXI CONT (MISCELLANEOUS) ×2 IMPLANT
BANDAGE ESMARK 6X9 LF (GAUZE/BANDAGES/DRESSINGS) ×1 IMPLANT
BLADE SAW SGTL 13.0X1.19X90.0M (BLADE) ×2 IMPLANT
BNDG CMPR 9X6 STRL LF SNTH (GAUZE/BANDAGES/DRESSINGS) ×1
BNDG CMPR MED 10X6 ELC LF (GAUZE/BANDAGES/DRESSINGS) ×1
BNDG CMPR MED 15X6 ELC VLCR LF (GAUZE/BANDAGES/DRESSINGS) ×1
BNDG ELASTIC 6X10 VLCR STRL LF (GAUZE/BANDAGES/DRESSINGS) ×2 IMPLANT
BNDG ELASTIC 6X15 VLCR STRL LF (GAUZE/BANDAGES/DRESSINGS) ×1 IMPLANT
BNDG ESMARK 6X9 LF (GAUZE/BANDAGES/DRESSINGS) ×2
BOWL SMART MIX CTS (DISPOSABLE) ×2 IMPLANT
BSPLAT TIB 5D E CMNT STM LT (Knees) ×1 IMPLANT
CEMENT BONE REFOBACIN R1X40 US (Cement) ×2 IMPLANT
CLSR STERI-STRIP ANTIMIC 1/2X4 (GAUZE/BANDAGES/DRESSINGS) ×4 IMPLANT
COVER SURGICAL LIGHT HANDLE (MISCELLANEOUS) ×2 IMPLANT
COVER WAND RF STERILE (DRAPES) ×2 IMPLANT
CUFF TOURNIQUET SINGLE 34IN LL (TOURNIQUET CUFF) ×2 IMPLANT
CUFF TOURNIQUET SINGLE 44IN (TOURNIQUET CUFF) IMPLANT
DRAPE EXTREMITY T 121X128X90 (DISPOSABLE) ×2 IMPLANT
DRAPE HALF SHEET 40X57 (DRAPES) ×2 IMPLANT
DRAPE INCISE IOBAN 66X45 STRL (DRAPES) IMPLANT
DRAPE ORTHO SPLIT 77X108 STRL (DRAPES) ×4
DRAPE POUCH INSTRU U-SHP 10X18 (DRAPES) ×2 IMPLANT
DRAPE SURG ORHT 6 SPLT 77X108 (DRAPES) ×2 IMPLANT
DRAPE U-SHAPE 47X51 STRL (DRAPES) ×4 IMPLANT
DURAPREP 26ML APPLICATOR (WOUND CARE) ×4 IMPLANT
ELECT CAUTERY BLADE 6.4 (BLADE) ×2 IMPLANT
ELECT REM PT RETURN 9FT ADLT (ELECTROSURGICAL) ×2
ELECTRODE REM PT RTRN 9FT ADLT (ELECTROSURGICAL) ×1 IMPLANT
FEMUR CMT CR STD SZ 6 LT KNEE (Joint) ×2 IMPLANT
FEMUR CMTD CR STD SZ 6 LT KNEE (Joint) IMPLANT
GAUZE SPONGE 4X4 12PLY STRL (GAUZE/BANDAGES/DRESSINGS) ×1 IMPLANT
GAUZE SPONGE 4X4 12PLY STRL LF (GAUZE/BANDAGES/DRESSINGS) ×2 IMPLANT
GLOVE BIO SURGEON STRL SZ 6.5 (GLOVE) ×3 IMPLANT
GLOVE BIOGEL PI IND STRL 7.0 (GLOVE) ×1 IMPLANT
GLOVE BIOGEL PI IND STRL 7.5 (GLOVE) IMPLANT
GLOVE BIOGEL PI INDICATOR 7.0 (GLOVE) ×1
GLOVE BIOGEL PI INDICATOR 7.5 (GLOVE) ×1
GLOVE ECLIPSE 7.0 STRL STRAW (GLOVE) ×6 IMPLANT
GLOVE SKINSENSE NS SZ7.5 (GLOVE) ×1
GLOVE SKINSENSE STRL SZ7.5 (GLOVE) ×1 IMPLANT
GLOVE SURG SYN 7.5  E (GLOVE) ×4
GLOVE SURG SYN 7.5 E (GLOVE) ×4 IMPLANT
GLOVE SURG SYN 7.5 PF PI (GLOVE) ×4 IMPLANT
GOWN STRL REIN XL XLG (GOWN DISPOSABLE) ×2 IMPLANT
GOWN STRL REUS W/ TWL LRG LVL3 (GOWN DISPOSABLE) ×1 IMPLANT
GOWN STRL REUS W/TWL LRG LVL3 (GOWN DISPOSABLE) ×2
HANDPIECE INTERPULSE COAX TIP (DISPOSABLE) ×2
HOOD PEEL AWAY FLYTE STAYCOOL (MISCELLANEOUS) ×4 IMPLANT
KIT BASIN OR (CUSTOM PROCEDURE TRAY) ×2 IMPLANT
KIT TURNOVER KIT B (KITS) ×2 IMPLANT
MANIFOLD NEPTUNE II (INSTRUMENTS) ×2 IMPLANT
MARKER SKIN DUAL TIP RULER LAB (MISCELLANEOUS) ×4 IMPLANT
NDL SPNL 18GX3.5 QUINCKE PK (NEEDLE) ×2 IMPLANT
NEEDLE SPNL 18GX3.5 QUINCKE PK (NEEDLE) ×4 IMPLANT
NS IRRIG 1000ML POUR BTL (IV SOLUTION) ×2 IMPLANT
PACK TOTAL JOINT (CUSTOM PROCEDURE TRAY) ×2 IMPLANT
PAD ABD 8X10 STRL (GAUZE/BANDAGES/DRESSINGS) ×4 IMPLANT
PAD ARMBOARD 7.5X6 YLW CONV (MISCELLANEOUS) ×4 IMPLANT
PADDING CAST COTTON 6X4 STRL (CAST SUPPLIES) ×2 IMPLANT
SAW OSC TIP CART 19.5X105X1.3 (SAW) ×2 IMPLANT
SET HNDPC FAN SPRY TIP SCT (DISPOSABLE) ×1 IMPLANT
STAPLER VISISTAT 35W (STAPLE) IMPLANT
STEM POLY PAT PLY 32M KNEE (Knees) ×1 IMPLANT
STEM TIBIA 5 DEG SZ E L KNEE (Knees) IMPLANT
STEM TIBIAL SZ6-7 EF 14 LT (Joint) ×1 IMPLANT
SUCTION FRAZIER HANDLE 10FR (MISCELLANEOUS) ×1
SUCTION TUBE FRAZIER 10FR DISP (MISCELLANEOUS) ×1 IMPLANT
SUT ETHILON 2 0 FS 18 (SUTURE) IMPLANT
SUT MNCRL AB 4-0 PS2 18 (SUTURE) IMPLANT
SUT VIC AB 0 CT1 27 (SUTURE) ×4
SUT VIC AB 0 CT1 27XBRD ANBCTR (SUTURE) ×2 IMPLANT
SUT VIC AB 1 CTX 27 (SUTURE) ×6 IMPLANT
SUT VIC AB 2-0 CT1 27 (SUTURE) ×8
SUT VIC AB 2-0 CT1 TAPERPNT 27 (SUTURE) ×4 IMPLANT
SYR 50ML LL SCALE MARK (SYRINGE) ×4 IMPLANT
TIBIA STEM 5 DEG SZ E L KNEE (Knees) ×2 IMPLANT
TOWEL OR 17X24 6PK STRL BLUE (TOWEL DISPOSABLE) ×2 IMPLANT
TOWEL OR 17X26 10 PK STRL BLUE (TOWEL DISPOSABLE) ×2 IMPLANT
TRAY CATH 16FR W/PLASTIC CATH (SET/KITS/TRAYS/PACK) ×1 IMPLANT
UNDERPAD 30X30 (UNDERPADS AND DIAPERS) ×2 IMPLANT
WRAP KNEE MAXI GEL POST OP (GAUZE/BANDAGES/DRESSINGS) ×2 IMPLANT

## 2018-04-29 NOTE — Anesthesia Procedure Notes (Signed)
Spinal  Patient location during procedure: OR Start time: 04/29/2018 10:00 AM End time: 04/29/2018 10:04 AM Staffing Anesthesiologist: Leilani Able, MD Performed: anesthesiologist  Preanesthetic Checklist Completed: patient identified, site marked, surgical consent, pre-op evaluation, timeout performed, IV checked, risks and benefits discussed and monitors and equipment checked Spinal Block Patient position: sitting Prep: site prepped and draped and DuraPrep Patient monitoring: continuous pulse ox and blood pressure Approach: midline Location: L3-4 Injection technique: single-shot Needle Needle type: Pencan  Needle gauge: 24 G Needle length: 10 cm Needle insertion depth: 6 cm Assessment Sensory level: T8

## 2018-04-29 NOTE — Anesthesia Postprocedure Evaluation (Signed)
Anesthesia Post Note  Patient: Robyn [name]  Procedure(s) Performed: LEFT TOTAL KNEE ARTHROPLASTY (Left Knee)     Patient location during evaluation: PACU Anesthesia Type: Spinal Level of consciousness: awake Pain management: pain level controlled Vital Signs Assessment: post-procedure vital signs reviewed and stable Respiratory status: spontaneous breathing Cardiovascular status: stable Postop Assessment: no headache, no backache, spinal receding and no apparent nausea or vomiting Anesthetic complications: no    Last Vitals:  Vitals:   04/29/18 1310 04/29/18 1325  BP: 120/67 128/66  Pulse: 62 (!) 59  Resp: 14 14  Temp:    SpO2: 92% 95%    Last Pain:  Vitals:   04/29/18 1310  TempSrc:   PainSc: 0-No pain   Pain Goal: Patients Stated Pain Goal: 3 (04/29/18 0830)               Caren Macadam

## 2018-04-29 NOTE — Transfer of Care (Signed)
Immediate Anesthesia Transfer of Care Note  Patient: Robyn [name]  Procedure(s) Performed: LEFT TOTAL KNEE ARTHROPLASTY (Left Knee)  Patient Location: PACU  Anesthesia Type:MAC  Level of Consciousness: awake, alert , patient cooperative and responds to stimulation  Airway & Oxygen Therapy: Patient Spontanous Breathing and Patient connected to face mask oxygen  Post-op Assessment: Report given to RN and Post -op Vital signs reviewed and stable  Post vital signs: Reviewed and stable  Last Vitals:  Vitals Value Taken Time  BP 116/63 04/29/2018 12:39 PM  Temp    Pulse 67 04/29/2018 12:40 PM  Resp 12 04/29/2018 12:40 PM  SpO2 99 % 04/29/2018 12:40 PM  Vitals shown include unvalidated device data.  Last Pain:  Vitals:   04/29/18 0830  TempSrc:   PainSc: 3       Patients Stated Pain Goal: 3 (41/74/08 1448)  Complications: No apparent anesthesia complications

## 2018-04-29 NOTE — Op Note (Addendum)
Total Knee Arthroplasty Procedure Note  Preoperative diagnosis: Left knee osteoarthritis  Postoperative diagnosis:same  Operative procedure: Left total knee arthroplasty. CPT [phone]  Surgeon: N. Glee Arvin, MD  Assist: April Green, RNFA  Anesthesia: Spinal, regional  Tourniquet time: see anesthesia record  Implants used: Zimmer Persona Femur: CR 6 Tibia: E Patella: 32 mm Polyethylene: 14 mm medial congruent  Indication: Robyn [name] is a 73 y.o. year old female with a history of knee pain. Having failed conservative management, the patient elected to proceed with a total knee arthroplasty.  We have reviewed the risk and benefits of the surgery and they elected to proceed after voicing understanding.  Procedure:  After informed consent was obtained and understanding of the risk were voiced including but not limited to bleeding, infection, damage to surrounding structures including nerves and vessels, blood clots, leg length inequality and the failure to achieve desired results, the operative extremity was marked with verbal confirmation of the patient in the holding area.   The patient was then brought to the operating room and transported to the operating room table in the supine position.  A tourniquet was applied to the operative extremity around the upper thigh. The operative limb was then prepped and draped in the usual sterile fashion and preoperative antibiotics were administered.  A time out was performed prior to the start of surgery confirming the correct extremity, preoperative antibiotic administration, as well as team members, implants and instruments available for the case. Correct surgical site was also confirmed with preoperative radiographs. The limb was then elevated for exsanguination and the tourniquet was inflated. A midline incision was made and a standard medial parapatellar approach was performed.  The patella was prepared and sized to a 32 mm.  A cover  was placed on the patella for protection from retractors.  We then turned our attention to the femur. Posterior cruciate ligament was sacrificed. Start site was drilled in the femur and the intramedullary distal femoral cutting guide was placed, set at 5 degrees valgus, taking 12 mm of distal resection. The distal cut was made. Osteophytes were then removed. Next, the proximal tibial cutting guide was placed with appropriate slope, varus/valgus alignment and depth of resection. The proximal tibial cut was made. Gap blocks were then used to assess the extension gap and alignment, and appropriate soft tissue releases were performed. Attention was turned back to the femur, which was sized using the sizing guide to a size 6. Appropriate rotation of the femoral component was determined using epicondylar axis, Whiteside's line, and assessing the flexion gap under ligament tension. The appropriate size 4-in-1 cutting block was placed and cuts were made. Posterior femoral osteophytes and uncapped bone were then removed with the curved osteotome. The tibia was sized for a size E component.  Trial components were placed, and stability was checked in full extension, mid-flexion, and deep flexion. Proper tibial rotation was determined and marked.  The patella tracked well without a lateral release. Trial components were then removed and tibial preparation performed. A posterior capsular injection comprising of 20 cc of 1.3% exparel and 40 cc of normal saline was performed for postoperative pain control. The bony surfaces were irrigated with a pulse lavage and then dried. Bone cement was vacuum mixed on the back table, and the final components sized above were cemented into place. After cement had finished curing, excess cement was removed. The stability of the construct was re-evaluated throughout a range of motion and found to be acceptable. The  trial liner was removed, the knee was copiously irrigated, and the knee was  re-evaluated for any excess bone debris. The real polyethylene liner, 14 mm thick, was inserted and checked to ensure the locking mechanism had engaged appropriately. The tourniquet was deflated and hemostasis was achieved. The wound was irrigated with normal saline.  One gram of vancomycin powder was placed in the surgical bed. A drain was not placed. Capsular closure was performed with a #1 vicryl, subcutaneous fat closed with a 0 vicryl suture, then subcutaneous tissue closed with interrupted 2.0 vicryl suture. The skin was then closed with a 3.0 monocryl. A sterile dressing was applied.  The patient was awakened in the operating room and taken to recovery in stable condition. All sponge, needle, and instrument counts were correct at the end of the case.  Position: supine  Complications: none.  Time Out: performed   Drains/Packing: none  Estimated blood loss: minimal  Returned to Recovery Room: in good condition.   Antibiotics: yes   Mechanical VTE (DVT) Prophylaxis: sequential compression devices, TED thigh-high  Chemical VTE (DVT) Prophylaxis: aspirin  Fluid Replacement  Crystalloid: see anesthesia record Blood: none  FFP: none   Specimens Removed: 1 to pathology   Sponge and Instrument Count Correct? yes   PACU: portable radiograph - knee AP and Lateral   Plan/RTC: Return in 2 weeks for wound check.   Weight Bearing/Load Lower Extremity: full   N. Glee Arvin, MD [facility] [phone] 11:58 AM

## 2018-04-29 NOTE — Anesthesia Procedure Notes (Signed)
Anesthesia Regional Block: Adductor canal block   Pre-Anesthetic Checklist: ,, timeout performed, Correct Patient, Correct Site, Correct Laterality, Correct Procedure, Correct Position, site marked, Risks and benefits discussed,  Surgical consent,  Pre-op evaluation,  At surgeon's request and post-op pain management  Laterality: Lower and Left  Prep: chloraprep       Needles:  Injection technique: Single-shot  Needle Type: Echogenic Stimulator Needle     Needle Length: 10cm  Needle Gauge: 21   Needle insertion depth: 2.5 cm   Additional Needles:   Procedures:,,,, ultrasound used (permanent image in chart),,,,  Narrative:  Start time: 04/29/2018 8:50 AM End time: 04/29/2018 8:58 AM Injection made incrementally with aspirations every 5 mL.  Performed by: Personally  Anesthesiologist: Leilani Able, MD

## 2018-04-29 NOTE — Progress Notes (Signed)
Orthopedic Tech Progress Note Patient Details:  Robyn [name] [facility] 08-19-1945 [id number]  CPM Left Knee CPM Left Knee: On Left Knee Flexion (Degrees): 90 Left Knee Extension (Degrees): 0  Post Interventions Patient Tolerated: Well Instructions Provided: Care of device  Robyn [name] 04/29/2018, 1:16 PM

## 2018-04-29 NOTE — H&P (Signed)
PREOPERATIVE H&P  Chief Complaint: left knee degenerative joint disease  HPI: Robyn [name] is a 73 y.o. female who presents for surgical treatment of left knee degenerative joint disease.  She denies any changes in medical history.  Past Medical History:  Diagnosis Date  . Arthritis   . Back pain   . Diabetes mellitus without complication (HCC)   . Heart murmur   . Hypertension    Past Surgical History:  Procedure Laterality Date  . ABDOMINAL HYSTERECTOMY    . BACK SURGERY     L4-L5  . CARPAL TUNNEL RELEASE Bilateral   . TONSILLECTOMY     Social History   Socioeconomic History  . Marital status: Married    Spouse name: Not on file  . Number of children: Not on file  . Years of education: Not on file  . Highest education level: Not on file  Occupational History  . Not on file  Social Needs  . Financial resource strain: Not on file  . Food insecurity:    Worry: Not on file    Inability: Not on file  . Transportation needs:    Medical: Not on file    Non-medical: Not on file  Tobacco Use  . Smoking status: Never Smoker  . Smokeless tobacco: Never Used  Substance and Sexual Activity  . Alcohol use: Yes    Alcohol/week: 3.0 standard drinks    Types: 2 Glasses of wine, 1 Shots of liquor per week    Comment: social drinker  . Drug use: No  . Sexual activity: Never  Lifestyle  . Physical activity:    Days per week: Not on file    Minutes per session: Not on file  . Stress: Not on file  Relationships  . Social connections:    Talks on phone: Not on file    Gets together: Not on file    Attends religious service: Not on file    Active member of club or organization: Not on file    Attends meetings of clubs or organizations: Not on file    Relationship status: Not on file  Other Topics Concern  . Not on file  Social History Narrative  . Not on file   Family History  Problem Relation Age of Onset  . Alzheimer's disease Mother   . Heart attack  Father   . Breast cancer Neg Hx    Allergies  Allergen Reactions  . Ibuprofen Other (See Comments)    Mouth sores  . Lipitor [Atorvastatin] Hives    Tolerates Crestor  . Metformin And Related Diarrhea  . Codeine Itching   Prior to Admission medications   Medication Sig Start Date End Date Taking? Authorizing Provider  acetaminophen (TYLENOL) 500 MG tablet Take 1,000 mg by mouth every morning.   Yes [provider]  amLODipine (NORVASC) 5 MG tablet Take 5 mg by mouth daily.   Yes [provider]  Calcium Carbonate-Vitamin D (CALCIUM 600+D PO) Take 1 tablet by mouth daily.   Yes [provider]  diclofenac sodium (VOLTAREN) 1 % GEL Apply 2 g topically 4 (four) times daily. Patient taking differently: Apply 2 g topically daily.  10/03/17  Yes Tarry Kos, MD  diphenhydramine-acetaminophen (TYLENOL PM) 25-500 MG TABS tablet Take 2 tablets by mouth at bedtime.   Yes [provider]  glimepiride (AMARYL) 4 MG tablet Take 4 mg by mouth 2 (two) times daily.    Yes [provider]  lisinopril-hydrochlorothiazide (  PRINZIDE,ZESTORETIC) 20-25 MG per tablet Take 1 tablet by mouth daily.   Yes [provider]  Methylcellulose, Laxative, (CITRUCEL PO) Take 1 tablet by mouth daily.   Yes [provider]  Multiple Vitamin (MULTIVITAMIN WITH MINERALS) TABS tablet Take 1 tablet by mouth at bedtime.   Yes [provider]  Omega-3 Fatty Acids (FISH OIL) 1000 MG CAPS Take 2,000 mg by mouth daily.   Yes [provider]  OVER THE COUNTER MEDICATION Take 1 tablet by mouth 2 (two) times daily. GBCQ   Yes [provider]  pantoprazole (PROTONIX) 40 MG tablet Take 40 mg by mouth See admin instructions. Take 40 mg by mouth daily for 14 days, do not take for 14 days then repeat.   Yes [provider]  rosuvastatin (CRESTOR) 20 MG tablet Take 20 mg by mouth daily.   Yes [provider]  traMADol (ULTRAM) 50 MG  tablet Take 1-2 tablets (50-100 mg total) by mouth 3 (three) times daily as needed. Patient taking differently: Take 50-100 mg by mouth 2 (two) times daily.  04/30/17  Yes Tarry Kos, MD  vitamin C (ASCORBIC ACID) 500 MG tablet Take 500 mg by mouth 2 (two) times daily.   Yes [provider]  Vitamin E 400 units TABS Take 400 Units by mouth daily.    Yes [provider]  meloxicam (MOBIC) 15 MG tablet Take 1 tablet (15 mg total) by mouth daily. Patient not taking: Reported on 04/15/2018 06/30/12   Kirby Funk, MD  omeprazole (PRILOSEC OTC) 20 MG tablet Take 1 tablet (20 mg total) by mouth daily. Patient not taking: Reported on 04/15/2018 06/30/12   Kirby Funk, MD     Positive ROS: All other systems have been reviewed and were otherwise negative with the exception of those mentioned in the HPI and as above.  Physical Exam: General: Alert, no acute distress Cardiovascular: No pedal edema Respiratory: No cyanosis, no use of accessory musculature GI: abdomen soft Skin: No lesions in the area of chief complaint Neurologic: Sensation intact distally Psychiatric: Patient is competent for consent with normal mood and affect Lymphatic: no lymphedema  MUSCULOSKELETAL: exam stable  Assessment: left knee degenerative joint disease  Plan: Plan for Procedure(s): LEFT TOTAL KNEE ARTHROPLASTY  The risks benefits and alternatives were discussed with the patient including but not limited to the risks of nonoperative treatment, versus surgical intervention including infection, bleeding, nerve injury,  blood clots, cardiopulmonary complications, morbidity, mortality, among others, and they were willing to proceed.   Preoperative templating of the joint replacement has been completed, documented, and submitted to the Operating Room personnel in order to optimize intra-operative equipment management.   Patient's anticipated LOS is less than 2 midnights, meeting these  requirements: - Younger than 29 - Lives within 1 hour of care - Has a competent adult at home to recover with post-op recover - NO history of  - Chronic pain requiring opiods  - Diabetes  - Coronary Artery Disease  - Heart failure  - Heart attack  - Stroke  - DVT/VTE  - Cardiac arrhythmia  - Respiratory Failure/COPD  - Renal failure  - Anemia  - Advanced Liver disease  Glee Arvin, MD   04/29/2018 7:27 AM

## 2018-04-30 DIAGNOSIS — I2510 Atherosclerotic heart disease of native coronary artery without angina pectoris: Secondary | ICD-10-CM | POA: Diagnosis not present

## 2018-04-30 DIAGNOSIS — Z791 Long term (current) use of non-steroidal anti-inflammatories (NSAID): Secondary | ICD-10-CM | POA: Diagnosis not present

## 2018-04-30 DIAGNOSIS — Z7984 Long term (current) use of oral hypoglycemic drugs: Secondary | ICD-10-CM | POA: Diagnosis not present

## 2018-04-30 DIAGNOSIS — E119 Type 2 diabetes mellitus without complications: Secondary | ICD-10-CM | POA: Diagnosis not present

## 2018-04-30 DIAGNOSIS — I110 Hypertensive heart disease with heart failure: Secondary | ICD-10-CM | POA: Diagnosis not present

## 2018-04-30 DIAGNOSIS — M1712 Unilateral primary osteoarthritis, left knee: Secondary | ICD-10-CM | POA: Diagnosis not present

## 2018-04-30 DIAGNOSIS — Z79899 Other long term (current) drug therapy: Secondary | ICD-10-CM | POA: Diagnosis not present

## 2018-04-30 DIAGNOSIS — J449 Chronic obstructive pulmonary disease, unspecified: Secondary | ICD-10-CM | POA: Diagnosis not present

## 2018-04-30 DIAGNOSIS — D62 Acute posthemorrhagic anemia: Secondary | ICD-10-CM | POA: Diagnosis not present

## 2018-04-30 DIAGNOSIS — I509 Heart failure, unspecified: Secondary | ICD-10-CM | POA: Diagnosis not present

## 2018-04-30 LAB — GLUCOSE, CAPILLARY
Glucose-Capillary: 102 mg/dL — ABNORMAL HIGH (ref 70–99)
Glucose-Capillary: 218 mg/dL — ABNORMAL HIGH (ref 70–99)
Glucose-Capillary: 329 mg/dL — ABNORMAL HIGH (ref 70–99)
Glucose-Capillary: 330 mg/dL — ABNORMAL HIGH (ref 70–99)

## 2018-04-30 LAB — CBC
HCT: 30.1 % — ABNORMAL LOW (ref 36.0–46.0)
Hemoglobin: 10.1 g/dL — ABNORMAL LOW (ref 12.0–15.0)
MCH: 30.8 pg (ref 26.0–34.0)
MCHC: 33.6 g/dL (ref 30.0–36.0)
MCV: 91.8 fL (ref 80.0–100.0)
Platelets: 168 10*3/uL (ref 150–400)
RBC: 3.28 MIL/uL — ABNORMAL LOW (ref 3.87–5.11)
RDW: 11.9 % (ref 11.5–15.5)
WBC: 8.5 10*3/uL (ref 4.0–10.5)
nRBC: 0 % (ref 0.0–0.2)

## 2018-04-30 LAB — BASIC METABOLIC PANEL
Anion gap: 7 (ref 5–15)
BUN: 22 mg/dL (ref 8–23)
CHLORIDE: 103 mmol/L (ref 98–111)
CO2: 25 mmol/L (ref 22–32)
Calcium: 8.4 mg/dL — ABNORMAL LOW (ref 8.9–10.3)
Creatinine, Ser: 1.15 mg/dL — ABNORMAL HIGH (ref 0.44–1.00)
GFR calc Af Amer: 54 mL/min — ABNORMAL LOW (ref >60)
GFR calc non Af Amer: 47 mL/min — ABNORMAL LOW (ref >60)
Glucose, Bld: 171 mg/dL — ABNORMAL HIGH (ref 70–99)
POTASSIUM: 4.3 mmol/L (ref 3.5–5.1)
Sodium: 135 mmol/L (ref 135–145)

## 2018-04-30 NOTE — Care Management Note (Signed)
Case Management Note  Patient Details  Name: Robyn [name] MRN: [id number] Date of Birth: Apr 05, 1946  Subjective/Objective:  Patient is a 73 yr old female s/p left total knee arthroplasty.                   Action/Plan: Case manager spoke with patient's husband and daughter concerning discharge plan. (Patient has been in considerable pain and is finally sleeping). Patient was preoperatively setup with [facility], no changes. She will have family support at discharge.   Expected Discharge Date:  pending              Expected Discharge Plan:  Home w Home Health Services  In-House Referral:  NA  Discharge planning Services  CM Consult  Post Acute Care Choice:  Home Health Choice offered to:  Spouse, Adult Children  DME Arranged:  N/A(has RW and 3in1) DME Agency:  NA  HH Arranged:  PT HH Agency:  [facility] (formerly [facility]  Status of Service:  Completed, signed off  If discussed at [employer], dates discussed:    Additional Comments:  Durenda Guthrie, RN 04/30/2018, 1:19 PM

## 2018-04-30 NOTE — Progress Notes (Signed)
Physical Therapy Treatment Patient Details Name: Robyn [name] MRN: [id number] DOB: 1946/04/24 Today's Date: 04/30/2018    History of Present Illness Patient is a 73 y/o female s/p L TKA on 04/29/17. PMH significant for back pain, DM, HTN, heart murmur.    PT Comments    Patient seen for mobility progression. Does till require Min A for bed mobility for LE management, otherwise, performing transfers and mobility at min guard level for safety. Improved heel strike this session, but does require verbal cueing to bring attention to task as well as for foot flat in stance phase of gait. Session focusing on gait, stair training, and exercise instruction with good tolerance to all activities. Making good progress towards goals - primary limiting factor pain currently. Will continue to follow.     Follow Up Recommendations  Follow surgeon's recommendation for DC plan and follow-up therapies     Equipment Recommendations  None recommended by PT    Recommendations for Other Services       Precautions / Restrictions Precautions Precautions: Fall Restrictions Weight Bearing Restrictions: Yes LLE Weight Bearing: Weight bearing as tolerated    Mobility  Bed Mobility Overal bed mobility: Needs Assistance Bed Mobility: Supine to Sit     Supine to sit: Min assist     General bed mobility comments: light Min A for LE management to EOB  Transfers Overall transfer level: Needs assistance Equipment used: Rolling walker (2 wheeled) Transfers: Sit to/from Stand Sit to Stand: Min guard         General transfer comment: min guard with cueing for hand placement for safety and efficiency  Ambulation/Gait Ambulation/Gait assistance: Min guard   Assistive device: Rolling walker (2 wheeled) Gait Pattern/deviations: Step-through pattern;Decreased stride length;Decreased stance time - left;Decreased weight shift to left;Antalgic Gait velocity: decreased   General Gait Details: continued  cueing for L heel strike and foot flat with stance phase - improved with distance   Stairs Stairs: Yes Stairs assistance: Min guard Stair Management: Two rails;Step to pattern;Forwards Number of Stairs: 2(2 sets) General stair comments: min guard for safety with cueing for sequencing   Wheelchair Mobility    Modified Rankin (Stroke Patients Only)       Balance Overall balance assessment: Mild deficits observed, not formally tested                                          Cognition Arousal/Alertness: Awake/alert Behavior During Therapy: WFL for tasks assessed/performed Overall Cognitive Status: Within Functional Limits for tasks assessed                                        Exercises Total Joint Exercises Ankle Circles/Pumps: AROM;Both;10 reps Quad Sets: AROM;Left;10 reps Gluteal Sets: AROM;Both;10 reps Heel Slides: AAROM;Left;10 reps Hip ABduction/ADduction: AROM;Left;10 reps Straight Leg Raises: AAROM;Left;10 reps Long Arc Quad: AAROM;Left;10 reps    General Comments General comments (skin integrity, edema, etc.): daughter present and supportive - ocncerned regarding pain levels      Pertinent Vitals/Pain Pain Assessment: 0-10 Pain Score: 6  Pain Location: L knee Pain Descriptors / Indicators: Aching;Grimacing;Guarding;Discomfort Pain Intervention(s): Limited activity within patient's tolerance;Monitored during session;Repositioned;Premedicated before session;Ice applied    Home Living  Prior Function            PT Goals (current goals can now be found in the care plan section) Acute Rehab PT Goals Patient Stated Goal: reduce pain PT Goal Formulation: With patient Time For Goal Achievement: 05/14/18 Potential to Achieve Goals: Good Progress towards PT goals: Progressing toward goals    Frequency    7X/week      PT Plan Current plan remains appropriate    Co-evaluation               AM-PAC PT "6 Clicks" Mobility   Outcome Measure  Help needed turning from your back to your side while in a flat bed without using bedrails?: A Little Help needed moving from lying on your back to sitting on the side of a flat bed without using bedrails?: A Little Help needed moving to and from a bed to a chair (including a wheelchair)?: A Little Help needed standing up from a chair using your arms (e.g., wheelchair or bedside chair)?: A Little Help needed to walk in hospital room?: A Little Help needed climbing 3-5 steps with a railing? : A Little 6 Click Score: 18    End of Session Equipment Utilized During Treatment: Gait belt Activity Tolerance: Patient tolerated treatment well Patient left: in chair;with call bell/phone within reach;with family/visitor present Nurse Communication: Mobility status PT Visit Diagnosis: Unsteadiness on feet (R26.81);Other abnormalities of gait and mobility (R26.89);Muscle weakness (generalized) (M62.81)     Time: 7106-2694 PT Time Calculation (min) (ACUTE ONLY): 46 min  Charges:  $Gait Training: 23-37 mins $Therapeutic Exercise: 8-22 mins                      Kipp Laurence, PT, DPT Supplemental Physical Therapist 04/30/18 3:26 PM Pager: [phone] Office: [phone]

## 2018-04-30 NOTE — Evaluation (Signed)
Physical Therapy Evaluation Patient Details Name: Robyn [name] MRN: [id number] DOB: 04-18-1946 Today's Date: 04/30/2018   History of Present Illness  Patient is a 73 y/o female s/p L TKA on 04/29/17. PMH significant for back pain, DM, HTN, heart murmur.  Clinical Impression  Patient admitted with the above listed diagnosis. Patient reports Mod I with mobility and ADLs prior to admission. Patient today requiring min guard assist for all transfers and mobility with patient primarily limited by pain at L knee. Education with patient and family regarding importance of mobility as well as full knee extension to promote normalized gait mechanics. PT to continue to follow to address gait, stair training, ROM, strength, and balance.    Follow Up Recommendations Follow surgeon's recommendation for DC plan and follow-up therapies    Equipment Recommendations  None recommended by PT    Recommendations for Other Services       Precautions / Restrictions Precautions Precautions: Fall Restrictions Weight Bearing Restrictions: Yes LLE Weight Bearing: Weight bearing as tolerated      Mobility  Bed Mobility Overal bed mobility: Needs Assistance Bed Mobility: Supine to Sit;Sit to Supine     Supine to sit: Min assist Sit to supine: Min assist   General bed mobility comments: Min A for LE management  Transfers Overall transfer level: Needs assistance Equipment used: Rolling walker (2 wheeled) Transfers: Sit to/from Stand Sit to Stand: Min guard         General transfer comment: verbal cueing for hand palcement and sequencing  Ambulation/Gait Ambulation/Gait assistance: Min guard Gait Distance (Feet): 75 Feet Assistive device: Rolling walker (2 wheeled) Gait Pattern/deviations: Step-to pattern;Step-through pattern;Decreased stride length;Decreased stance time - left;Decreased weight shift to left;Antalgic Gait velocity: decreased   General Gait Details: cueing for heel strike and  weight bearing through heel as pateint prefers to come up on toes to offload LE; cueing for safety and sequencing  Stairs            Wheelchair Mobility    Modified Rankin (Stroke Patients Only)       Balance Overall balance assessment: Mild deficits observed, not formally tested                                           Pertinent Vitals/Pain Pain Assessment: 0-10 Pain Score: 5  Pain Location: L knee Pain Descriptors / Indicators: Aching;Grimacing;Guarding;Discomfort Pain Intervention(s): Limited activity within patient's tolerance;Monitored during session;Repositioned    Home Living Family/patient expects to be discharged to:: Private residence Living Arrangements: Spouse/significant other;Children Available Help at Discharge: Family;Available 24 hours/day Type of Home: House(townhome) Home Access: (1 step)     Home Layout: Two level;Able to live on main level with bedroom/bathroom(plans to pull bed out from couch on first floor) Home Equipment: Walker - 2 wheels;Cane - single point;Bedside commode;Shower seat      Prior Function Level of Independence: Independent               Hand Dominance        Extremity/Trunk Assessment   Upper Extremity Assessment Upper Extremity Assessment: Overall WFL for tasks assessed    Lower Extremity Assessment Lower Extremity Assessment: LLE deficits/detail LLE Deficits / Details: expected post-op pain and weakness    Cervical / Trunk Assessment Cervical / Trunk Assessment: Normal  Communication   Communication: No difficulties  Cognition Arousal/Alertness: Awake/alert Behavior During Therapy: WFL for tasks  assessed/performed Overall Cognitive Status: Within Functional Limits for tasks assessed                                        General Comments General comments (skin integrity, edema, etc.): family present and supportive    Exercises     Assessment/Plan    PT  Assessment Patient needs continued PT services  PT Problem List Decreased strength;Decreased range of motion;Decreased activity tolerance;Decreased balance;Decreased mobility;Decreased knowledge of use of DME;Decreased safety awareness       PT Treatment Interventions DME instruction;Gait training;Stair training;Functional mobility training;Therapeutic exercise;Therapeutic activities;Balance training;Patient/family education    PT Goals (Current goals can be found in the Care Plan section)  Acute Rehab PT Goals Patient Stated Goal: reduce pain PT Goal Formulation: With patient Time For Goal Achievement: 05/14/18 Potential to Achieve Goals: Good    Frequency 7X/week   Barriers to discharge        Co-evaluation               AM-PAC PT "6 Clicks" Mobility  Outcome Measure Help needed turning from your back to your side while in a flat bed without using bedrails?: A Little Help needed moving from lying on your back to sitting on the side of a flat bed without using bedrails?: A Little Help needed moving to and from a bed to a chair (including a wheelchair)?: A Little Help needed standing up from a chair using your arms (e.g., wheelchair or bedside chair)?: A Little Help needed to walk in hospital room?: A Little Help needed climbing 3-5 steps with a railing? : A Lot 6 Click Score: 17    End of Session Equipment Utilized During Treatment: Gait belt Activity Tolerance: Patient tolerated treatment well Patient left: in bed;with call bell/phone within reach;with family/visitor present Nurse Communication: Mobility status PT Visit Diagnosis: Unsteadiness on feet (R26.81);Other abnormalities of gait and mobility (R26.89);Muscle weakness (generalized) (M62.81)    Time: 7902-4097 PT Time Calculation (min) (ACUTE ONLY): 25 min   Charges:   PT Evaluation $PT Eval Moderate Complexity: 1 Mod          Kipp Laurence, PT, DPT Supplemental Physical Therapist 04/30/18 11:25  AM Pager: [phone] Office: [phone]

## 2018-04-30 NOTE — Discharge Summary (Addendum)
Patient ID: Robyn [name] MRN: [id number] DOB/AGE: [birth year] 73 y.o.  Admit date: 04/29/2018 Discharge date: 05/01/2018  Admission Diagnoses:  Active Problems:   Primary osteoarthritis of left knee   Total knee replacement status   Discharge Diagnoses:  Same  Past Medical History:  Diagnosis Date  . Arthritis   . Back pain   . Diabetes mellitus without complication (HCC)   . Heart murmur   . Hypertension     Surgeries: Procedure(s): LEFT TOTAL KNEE ARTHROPLASTY on 04/29/2018   Consultants:   Discharged Condition: Improved  Hospital Course: Robyn [name] is an 73 y.o. female who was admitted 04/29/2018 for operative treatment of primary localized osteoarthritis lef tknee. Patient has severe unremitting pain that affects sleep, daily activities, and work/hobbies. After pre-op clearance the patient was taken to the operating room on 04/29/2018 and underwent  Procedure(s): LEFT TOTAL KNEE ARTHROPLASTY.    Patient was given perioperative antibiotics:  Anti-infectives (From admission, onward)   Start     Dose/Rate Route Frequency Ordered Stop   04/29/18 1915  ceFAZolin (ANCEF) IVPB 2g/100 mL premix     2 g 200 mL/hr over 30 Minutes Intravenous Every 6 hours 04/29/18 1914 04/30/18 0705   04/29/18 0949  vancomycin (VANCOCIN) powder  Status:  Discontinued       As needed 04/29/18 0949 04/29/18 1234   04/29/18 0830  ceFAZolin (ANCEF) IVPB 2g/100 mL premix     2 g 200 mL/hr over 30 Minutes Intravenous On call to O.R. 04/29/18 0819 04/29/18 1022   04/29/18 0825  ceFAZolin (ANCEF) 2-4 GM/100ML-% IVPB    Note to Pharmacy:  [facility]   : cabinet override      04/29/18 0825 04/29/18 1007   04/29/18 0000  sulfamethoxazole-trimethoprim (BACTRIM DS,SEPTRA DS) 800-160 MG tablet     1 tablet Oral 2 times daily 04/29/18 1202         Patient was given sequential compression devices, early ambulation, and chemoprophylaxis to prevent DVT.  Patient benefited maximally from hospital  stay and there were no complications.    Recent vital signs:  Patient Vitals for the past 24 hrs:  BP Temp Temp src Pulse Resp SpO2  05/01/18 0403 (!) 164/74 98.6 F (37 C) Oral 92 20 95 %  04/30/18 2006 (!) 167/90 98.7 F (37.1 C) Oral 83 18 96 %     Recent laboratory studies:  Recent Labs    04/30/18 0143  WBC 8.5  HGB 10.1*  HCT 30.1*  PLT 168  NA 135  K 4.3  CL 103  CO2 25  BUN 22  CREATININE 1.15*  GLUCOSE 171*  CALCIUM 8.4*     Discharge Medications:   Allergies as of 05/01/2018      Reactions   Ibuprofen Other (See Comments)   Mouth sores   Lipitor [atorvastatin] Hives   Tolerates Crestor   Metformin And Related Diarrhea   Codeine Itching      Medication List    STOP taking these medications   acetaminophen 500 MG tablet Commonly known as:  TYLENOL   Fish Oil 1000 MG Caps   meloxicam 15 MG tablet Commonly known as:  MOBIC   traMADol 50 MG tablet Commonly known as:  ULTRAM     TAKE these medications   amLODipine 5 MG tablet Commonly known as:  NORVASC Take 5 mg by mouth daily.   CALCIUM 600+D PO Take 1 tablet by mouth daily.   CITRUCEL PO Take 1 tablet by mouth daily.  diclofenac sodium 1 % Gel Commonly known as:  VOLTAREN Apply 2 g topically 4 (four) times daily. What changed:  when to take this   diphenhydramine-acetaminophen 25-500 MG Tabs tablet Commonly known as:  TYLENOL PM Take 2 tablets by mouth at bedtime.   glimepiride 4 MG tablet Commonly known as:  AMARYL Take 4 mg by mouth 2 (two) times daily.   lisinopril-hydrochlorothiazide 20-25 MG tablet Commonly known as:  PRINZIDE,ZESTORETIC Take 1 tablet by mouth daily.   methocarbamol 750 MG tablet Commonly known as:  ROBAXIN Take 1 tablet (750 mg total) by mouth 2 (two) times daily as needed for muscle spasms.   multivitamin with minerals Tabs tablet Take 1 tablet by mouth at bedtime.   omeprazole 20 MG tablet Commonly known as:  PRILOSEC OTC Take 1 tablet (20 mg  total) by mouth daily.   ondansetron 4 MG tablet Commonly known as:  ZOFRAN Take 1-2 tablets (4-8 mg total) by mouth every 8 (eight) hours as needed for nausea or vomiting.   OVER THE COUNTER MEDICATION Take 1 tablet by mouth 2 (two) times daily. GBCQ   oxyCODONE 10 mg 12 hr tablet Commonly known as:  OXYCONTIN Take 1 tablet (10 mg total) by mouth every 12 (twelve) hours for 3 days.   oxyCODONE 5 MG immediate release tablet Commonly known as:  Oxy IR/ROXICODONE Take 1-3 tablets (5-15 mg total) by mouth every 4 (four) hours as needed.   pantoprazole 40 MG tablet Commonly known as:  PROTONIX Take 40 mg by mouth See admin instructions. Take 40 mg by mouth daily for 14 days, do not take for 14 days then repeat.   promethazine 25 MG tablet Commonly known as:  PHENERGAN Take 1 tablet (25 mg total) by mouth every 6 (six) hours as needed for nausea.   rivaroxaban 10 MG Tabs tablet Commonly known as:  XARELTO Take 1 tablet (10 mg total) by mouth daily for 28 days.   rosuvastatin 20 MG tablet Commonly known as:  CRESTOR Take 20 mg by mouth daily.   senna-docusate 8.6-50 MG tablet Commonly known as:  SENOKOT S Take 1 tablet by mouth at bedtime as needed.   sulfamethoxazole-trimethoprim 800-160 MG tablet Commonly known as:  BACTRIM DS,SEPTRA DS Take 1 tablet by mouth 2 (two) times daily.   vitamin C 500 MG tablet Commonly known as:  ASCORBIC ACID Take 500 mg by mouth 2 (two) times daily.   Vitamin E 400 units Tabs Take 400 Units by mouth daily.            Durable Medical Equipment  (From admission, onward)         Start     Ordered   04/29/18 2017  DME Walker rolling  Once    Question:  Patient needs a walker to treat with the following condition  Answer:  Total knee replacement status   04/29/18 2016   04/29/18 2017  DME 3 n 1  Once     04/29/18 2016   04/29/18 2017  DME Bedside commode  Once    Question:  Patient needs a bedside commode to treat with the  following condition  Answer:  Total knee replacement status   04/29/18 2016          Diagnostic Studies: Dg Chest 2 View  Result Date: 04/18/2018 CLINICAL DATA:  Preop knee surgery EXAM: CHEST - 2 VIEW COMPARISON:  06/29/2012 FINDINGS: The heart size and mediastinal contours are within normal limits. Both lungs are clear.  The visualized skeletal structures are unremarkable. IMPRESSION: No active cardiopulmonary disease. Electronically Signed   By: Elige Ko   On: 04/18/2018 12:42   Dg Knee Left Port  Result Date: 04/29/2018 CLINICAL DATA:  Follow-up knee replacement EXAM: PORTABLE LEFT KNEE - 1-2 VIEW COMPARISON:  None. FINDINGS: Total knee arthroplasty has a good appearance. Components appear well positioned. No radiographically detectable complication. IMPRESSION: Good appearance following total knee arthroplasty. Electronically Signed   By: Paulina Fusi M.D.   On: 04/29/2018 14:01    Disposition: Discharge disposition: 01-Home or Self Care         Follow-up Information    Tarry Kos, MD In 2 weeks.   Specialty:  Orthopedic Surgery Why:  For suture removal, For wound re-check Contact information: [id number] [place] [address] [phone]        Home, [facility] Follow up.   Specialty:  Home Health Services Why:  A representative from [facility] will contact you to arrange start date and time for your therapy. Contact information: [address] [place] [address] [phone]            Signed: Cristie Hem 05/01/2018, 1:02 PM

## 2018-04-30 NOTE — Progress Notes (Signed)
Subjective: 1 Day Post-Op Procedure(s) (LRB): LEFT TOTAL KNEE ARTHROPLASTY (Left) Patient reports pain as mild.  Doing well this am.   Objective: Vital signs in last 24 hours: Temp:  [97.3 F (36.3 C)-98.6 F (37 C)] 98.4 F (36.9 C) (01/07 0410) Pulse Rate:  [59-77] 69 (01/07 0410) Resp:  [9-26] 26 (01/07 0410) BP: (107-181)/(55-82) 107/55 (01/07 0410) SpO2:  [92 %-100 %] 92 % (01/07 0410)  Intake/Output from previous day: 01/06 0701 - 01/07 0700 In: 2370 [P.O.:720; I.V.:1650] Out: 1180 [Urine:1175; Blood:5] Intake/Output this shift: No intake/output data recorded.  Recent Labs    04/30/18 0143  HGB 10.1*   Recent Labs    04/30/18 0143  WBC 8.5  RBC 3.28*  HCT 30.1*  PLT 168   Recent Labs    04/30/18 0143  NA 135  K 4.3  CL 103  CO2 25  BUN 22  CREATININE 1.15*  GLUCOSE 171*  CALCIUM 8.4*   No results for input(s): LABPT, INR in the last 72 hours.  Neurologically intact Neurovascular intact Sensation intact distally Intact pulses distally Dorsiflexion/Plantar flexion intact Incision: dressing C/D/I No cellulitis present Compartment soft  Anticipated LOS equal to or greater than 2 midnights due to - Age 30 and older with one or more of the following:  - Obesity  - Expected need for hospital services (PT, OT, Nursing) required for safe  discharge  - Anticipated need for postoperative skilled nursing care or inpatient rehab  - Active co-morbidities: Diabetes OR   - Unanticipated findings during/Post Surgery: Slow post-op progression: GI, pain control, mobility  - Patient is a high risk of re-admission due to: None   Assessment/Plan: 1 Day Post-Op Procedure(s) (LRB): LEFT TOTAL KNEE ARTHROPLASTY (Left) Advance diet Up with therapy D/C IV fluids Discharge home with home health today as long as she mobilizes well with PT ABLA-mild and stable Dressing changed by me today [facility]AT LLE Nurse to apply ted hose    Cristie Hem 04/30/2018, 8:13  AM

## 2018-05-01 ENCOUNTER — Encounter (HOSPITAL_COMMUNITY): Payer: Self-pay | Admitting: Orthopaedic Surgery

## 2018-05-01 DIAGNOSIS — I2510 Atherosclerotic heart disease of native coronary artery without angina pectoris: Secondary | ICD-10-CM | POA: Diagnosis not present

## 2018-05-01 DIAGNOSIS — Z791 Long term (current) use of non-steroidal anti-inflammatories (NSAID): Secondary | ICD-10-CM | POA: Diagnosis not present

## 2018-05-01 DIAGNOSIS — Z7984 Long term (current) use of oral hypoglycemic drugs: Secondary | ICD-10-CM | POA: Diagnosis not present

## 2018-05-01 DIAGNOSIS — M1712 Unilateral primary osteoarthritis, left knee: Secondary | ICD-10-CM | POA: Diagnosis not present

## 2018-05-01 DIAGNOSIS — D62 Acute posthemorrhagic anemia: Secondary | ICD-10-CM | POA: Diagnosis not present

## 2018-05-01 DIAGNOSIS — E119 Type 2 diabetes mellitus without complications: Secondary | ICD-10-CM | POA: Diagnosis not present

## 2018-05-01 DIAGNOSIS — Z79899 Other long term (current) drug therapy: Secondary | ICD-10-CM | POA: Diagnosis not present

## 2018-05-01 DIAGNOSIS — I509 Heart failure, unspecified: Secondary | ICD-10-CM | POA: Diagnosis not present

## 2018-05-01 DIAGNOSIS — I110 Hypertensive heart disease with heart failure: Secondary | ICD-10-CM | POA: Diagnosis not present

## 2018-05-01 DIAGNOSIS — J449 Chronic obstructive pulmonary disease, unspecified: Secondary | ICD-10-CM | POA: Diagnosis not present

## 2018-05-01 LAB — GLUCOSE, CAPILLARY
GLUCOSE-CAPILLARY: 220 mg/dL — AB (ref 70–99)
Glucose-Capillary: 121 mg/dL — ABNORMAL HIGH (ref 70–99)
Glucose-Capillary: 201 mg/dL — ABNORMAL HIGH (ref 70–99)

## 2018-05-01 MED ORDER — RIVAROXABAN 10 MG PO TABS: 10.0000 mg | ORAL_TABLET | Freq: Every day | ORAL | 0 refills | Status: DC

## 2018-05-01 MED ORDER — RIVAROXABAN 10 MG PO TABS
10.0000 mg | ORAL_TABLET | Freq: Every day | ORAL | Status: DC
  Administered 2018-05-01: 10 mg via ORAL
  Filled 2018-05-01: qty 1

## 2018-05-01 NOTE — Discharge Instructions (Signed)
INSTRUCTIONS AFTER JOINT REPLACEMENT  ° °o Remove items at home which could result in a fall. This includes throw rugs or furniture in walking pathways °o ICE to the affected joint every three hours while awake for 30 minutes at a time, for at least the first 3-5 days, and then as needed for pain and swelling.  Continue to use ice for pain and swelling. You may notice swelling that will progress down to the foot and ankle.  This is normal after surgery.  Elevate your leg when you are not up walking on it.   °o Continue to use the breathing machine you got in the hospital (incentive spirometer) which will help keep your temperature down.  It is common for your temperature to cycle up and down following surgery, especially at night when you are not up moving around and exerting yourself.  The breathing machine keeps your lungs expanded and your temperature down. ° ° °DIET:  As you were doing prior to hospitalization, we recommend a well-balanced diet. ° °DRESSING / WOUND CARE / SHOWERING ° °You may change your surgical dressing 7 days after surgery.  Then change the dressing every day with sterile gauze.  Please use good hand washing techniques before changing the dressing.  Do not use any lotions or creams on the incision until instructed by your surgeon.  You may shower while you have the surgical dressing which is waterproof.  After removal of surgical dressing, you must cover the incision when showering. ° °ACTIVITY ° °o Increase activity slowly as tolerated, but follow the weight bearing instructions below.   °o No driving for 6 weeks or until further direction given by your physician.  You cannot drive while taking narcotics.  °o No lifting or carrying greater than 10 lbs. until further directed by your surgeon. °o Avoid periods of inactivity such as sitting longer than an hour when not asleep. This helps prevent blood clots.  °o You may return to work once you are authorized by your doctor.  ° ° ° °WEIGHT  BEARING  ° °Weight bearing as tolerated with assist device (walker, cane, etc) as directed, use it as long as suggested by your surgeon or therapist, typically at least 4-6 weeks. ° ° °EXERCISES ° °Results after joint replacement surgery are often greatly improved when you follow the exercise, range of motion and muscle strengthening exercises prescribed by your doctor. Safety measures are also important to protect the joint from further injury. Any time any of these exercises cause you to have increased pain or swelling, decrease what you are doing until you are comfortable again and then slowly increase them. If you have problems or questions, call your caregiver or physical therapist for advice.  ° °Rehabilitation is important following a joint replacement. After just a few days of immobilization, the muscles of the leg can become weakened and shrink (atrophy).  These exercises are designed to build up the tone and strength of the thigh and leg muscles and to improve motion. Often times heat used for twenty to thirty minutes before working out will loosen up your tissues and help with improving the range of motion but do not use heat for the first two weeks following surgery (sometimes heat can increase post-operative swelling).  ° °These exercises can be done on a training (exercise) mat, on the floor, on a table or on a bed. Use whatever works the best and is most comfortable for you.    Use music or television while   you are exercising so that the exercises are a pleasant break in your day. This will make your life better with the exercises acting as a break in your routine that you can look forward to.   Perform all exercises about fifteen times, three times per day or as directed.  You should exercise both the operative leg and the other leg as well. ° °Exercises include: °  °• Quad Sets - Tighten up the muscle on the front of the thigh (Quad) and hold for 5-10 seconds.   °• Straight Leg Raises - With your  knee straight (if you were given a brace, keep it on), lift the leg to 60 degrees, hold for 3 seconds, and slowly lower the leg.  Perform this exercise against resistance later as your leg gets stronger.  °• Leg Slides: Lying on your back, slowly slide your foot toward your buttocks, bending your knee up off the floor (only go as far as is comfortable). Then slowly slide your foot back down until your leg is flat on the floor again.  °• Angel Wings: Lying on your back spread your legs to the side as far apart as you can without causing discomfort.  °• Hamstring Strength:  Lying on your back, push your heel against the floor with your leg straight by tightening up the muscles of your buttocks.  Repeat, but this time bend your knee to a comfortable angle, and push your heel against the floor.  You may put a pillow under the heel to make it more comfortable if necessary.  ° °A rehabilitation program following joint replacement surgery can speed recovery and prevent re-injury in the future due to weakened muscles. Contact your doctor or a physical therapist for more information on knee rehabilitation.  ° ° °CONSTIPATION ° °Constipation is defined medically as fewer than three stools per week and severe constipation as less than one stool per week.  Even if you have a regular bowel pattern at home, your normal regimen is likely to be disrupted due to multiple reasons following surgery.  Combination of anesthesia, postoperative narcotics, change in appetite and fluid intake all can affect your bowels.  ° °YOU MUST use at least one of the following options; they are listed in order of increasing strength to get the job done.  They are all available over the counter, and you may need to use some, POSSIBLY even all of these options:   ° °Drink plenty of fluids (prune juice may be helpful) and high fiber foods °Colace 100 mg by mouth twice a day  °Senokot for constipation as directed and as needed Dulcolax (bisacodyl), take  with full glass of water  °Miralax (polyethylene glycol) once or twice a day as needed. ° °If you have tried all these things and are unable to have a bowel movement in the first 3-4 days after surgery call either your surgeon or your primary doctor.   ° °If you experience loose stools or diarrhea, hold the medications until you stool forms back up.  If your symptoms do not get better within 1 week or if they get worse, check with your doctor.  If you experience "the worst abdominal pain ever" or develop nausea or vomiting, please contact the office immediately for further recommendations for treatment. ° ° °ITCHING:  If you experience itching with your medications, try taking only a single pain pill, or even half a pain pill at a time.  You can also use Benadryl over the counter   for itching or also to help with sleep.  ° °TED HOSE STOCKINGS:  Use stockings on both legs until for at least 2 weeks or as directed by physician office. They may be removed at night for sleeping. ° °MEDICATIONS:  See your medication summary on the “After Visit Summary” that nursing will review with you.  You may have some home medications which will be placed on hold until you complete the course of blood thinner medication.  It is important for you to complete the blood thinner medication as prescribed. ° °PRECAUTIONS:  If you experience chest pain or shortness of breath - call 911 immediately for transfer to the hospital emergency department.  ° °If you develop a fever greater that 101 F, purulent drainage from wound, increased redness or drainage from wound, foul odor from the wound/dressing, or calf pain - CONTACT YOUR SURGEON.   °                                                °FOLLOW-UP APPOINTMENTS:  If you do not already have a post-op appointment, please call the office for an appointment to be seen by your surgeon.  Guidelines for how soon to be seen are listed in your “After Visit Summary”, but are typically between 1-4 weeks  after surgery. ° °OTHER INSTRUCTIONS:  ° °Knee Replacement:  Do not place pillow under knee, focus on keeping the knee straight while resting. CPM instructions: 0-90 degrees, 2 hours in the morning, 2 hours in the afternoon, and 2 hours in the evening. Place foam block, curve side up under heel at all times except when in CPM or when walking.  DO NOT modify, tear, cut, or change the foam block in any way. ° °MAKE SURE YOU:  °• Understand these instructions.  °• Get help right away if you are not doing well or get worse.  ° ° °Thank you for letting us be a part of your medical care team.  It is a privilege we respect greatly.  We hope these instructions will help you stay on track for a fast and full recovery!  ° ° ° ° °Information on my medicine - XARELTO® (Rivaroxaban) ° °This medication education was reviewed with me or my healthcare representative as part of my discharge preparation.  The pharmacist that spoke with me during my hospital stay was:  Elliet Goodnow Dien, RPH ° °Why was Xarelto® prescribed for you? °Xarelto® was prescribed for you to reduce the risk of blood clots forming after orthopedic surgery. The medical term for these abnormal blood clots is venous thromboembolism (VTE). ° °What do you need to know about xarelto® ? °Take your Xarelto® ONCE DAILY at the same time every day. °You may take it either with or without food. ° °If you have difficulty swallowing the tablet whole, you may crush it and mix in applesauce just prior to taking your dose. ° °Take Xarelto® exactly as prescribed by your doctor and DO NOT stop taking Xarelto® without talking to the doctor who prescribed the medication.  Stopping without other VTE prevention medication to take the place of Xarelto® may increase your risk of developing a clot. ° °After discharge, you should have regular check-up appointments with your healthcare provider that is prescribing your Xarelto®.   ° °What do you do if you miss a dose? °If you miss   a dose,  take it as soon as you remember on the same day then continue your regularly scheduled once daily regimen the next day. Do not take two doses of Xarelto® on the same day.  ° °Important Safety Information °A possible side effect of Xarelto® is bleeding. You should call your healthcare provider right away if you experience any of the following: °? Bleeding from an injury or your nose that does not stop. °? Unusual colored urine (red or dark brown) or unusual colored stools (red or black). °? Unusual bruising for unknown reasons. °? A serious fall or if you hit your head (even if there is no bleeding). ° °Some medicines may interact with Xarelto® and might increase your risk of bleeding while on Xarelto®. To help avoid this, consult your healthcare provider or pharmacist prior to using any new prescription or non-prescription medications, including herbals, vitamins, non-steroidal anti-inflammatory drugs (NSAIDs) and supplements. ° °This website has more information on Xarelto®: www.xarelto.com. ° ° ° °

## 2018-05-01 NOTE — Progress Notes (Signed)
Patient discharging home. Discharge instructions explained to patient and she verbalized understanding. Took all personal belongings. No further questions or concerns voiced.  

## 2018-05-01 NOTE — Progress Notes (Signed)
Physical Therapy Treatment Patient Details Name: Robyn [name] MRN: [id number] DOB: [birth year] Today's Date: 05/01/2018    History of Present Illness Patient is a 73 y/o female s/p L TKA on 04/29/17. PMH significant for back pain, DM, HTN, heart murmur.    PT Comments    Patient seen this AM for mobility progression. Progressing gait with patient initially ambulating with step to pattern and able to progress to step through. Reports some new onset trembling with nursing/MD aware. Making good progress towards goals. Patient prefers to see PT for one more session prior to d/c home with family.     Follow Up Recommendations  Follow surgeon's recommendation for DC plan and follow-up therapies     Equipment Recommendations  None recommended by PT    Recommendations for Other Services       Precautions / Restrictions Precautions Precautions: Fall Restrictions Weight Bearing Restrictions: Yes LLE Weight Bearing: Weight bearing as tolerated    Mobility  Bed Mobility               General bed mobility comments: up in recliner upon entry  Transfers Overall transfer level: Needs assistance Equipment used: Rolling walker (2 wheeled) Transfers: Sit to/from Stand Sit to Stand: Min guard         General transfer comment: min guard; good hand placement; reduce weight shift while standing with slight posterior LOBrequiring min guard for safety  Ambulation/Gait Ambulation/Gait assistance: Min guard Gait Distance (Feet): 300 Feet Assistive device: Rolling walker (2 wheeled) Gait Pattern/deviations: Step-through pattern;Decreased stride length;Decreased stance time - left;Decreased weight shift to left;Antalgic Gait velocity: decreased   General Gait Details: initially with step to pattern - able to self progress to step through; noted reduced weight shift to operative LE; no LOB    Stairs             Wheelchair Mobility    Modified Rankin (Stroke Patients  Only)       Balance Overall balance assessment: Mild deficits observed, not formally tested                                          Cognition Arousal/Alertness: Awake/alert Behavior During Therapy: WFL for tasks assessed/performed Overall Cognitive Status: Within Functional Limits for tasks assessed                                        Exercises Total Joint Exercises Ankle Circles/Pumps: AROM;Both;10 reps Quad Sets: AROM;Left;10 reps Heel Slides: AAROM;Left;10 reps Straight Leg Raises: AAROM;Left;10 reps Long Arc Quad: AAROM;Left;10 reps    General Comments General comments (skin integrity, edema, etc.): daughter present; concerns due to new onset shaking/tremors? - nursing aware      Pertinent Vitals/Pain Pain Assessment: 0-10 Pain Score: 3  Pain Location: L knee Pain Descriptors / Indicators: Aching;Grimacing;Guarding;Discomfort Pain Intervention(s): Limited activity within patient's tolerance;Monitored during session;Repositioned    Home Living                      Prior Function            PT Goals (current goals can now be found in the care plan section) Acute Rehab PT Goals Patient Stated Goal: reduce pain PT Goal Formulation: With patient Time For Goal Achievement: 05/14/18 Potential to Achieve  Goals: Good Progress towards PT goals: Progressing toward goals    Frequency    7X/week      PT Plan Current plan remains appropriate    Co-evaluation              AM-PAC PT "6 Clicks" Mobility   Outcome Measure  Help needed turning from your back to your side while in a flat bed without using bedrails?: A Little Help needed moving from lying on your back to sitting on the side of a flat bed without using bedrails?: A Little Help needed moving to and from a bed to a chair (including a wheelchair)?: A Little Help needed standing up from a chair using your arms (e.g., wheelchair or bedside chair)?: A  Little Help needed to walk in hospital room?: A Little Help needed climbing 3-5 steps with a railing? : A Little 6 Click Score: 18    End of Session Equipment Utilized During Treatment: Gait belt Activity Tolerance: Patient tolerated treatment well Patient left: in chair;with call bell/phone within reach;with family/visitor present Nurse Communication: Mobility status PT Visit Diagnosis: Unsteadiness on feet (R26.81);Other abnormalities of gait and mobility (R26.89);Muscle weakness (generalized) (M62.81)     Time: 9381-0175 PT Time Calculation (min) (ACUTE ONLY): 28 min  Charges:  $Gait Training: 8-22 mins $Therapeutic Exercise: 8-22 mins                      Kipp Laurence, PT, DPT Supplemental Physical Therapist 05/01/18 9:56 AM Pager: [phone] Office: [phone]

## 2018-05-01 NOTE — Care Management Obs Status (Signed)
MEDICARE OBSERVATION STATUS NOTIFICATION   Patient Details  Name: Robyn [name] MRN: [id number] Date of Birth: 1946-04-07   Medicare Observation Status Notification Given:  Yes    Durenda Guthrie, RN 05/01/2018, 10:33 AM

## 2018-05-01 NOTE — Progress Notes (Signed)
Physical Therapy Treatment Patient Details Name: Robyn [name] MRN: [id number] DOB: 12/20/1945 Today's Date: 05/01/2018    History of Present Illness Patient is a 73 y/o female s/p L TKA on 04/29/17. PMH significant for back pain, DM, HTN, heart murmur.    PT Comments    Patient doing well with all mobility. Generally min guard to supervision for mobility with no LOB or overt instability; improved heel strike with gait. All questions related to PT answered with patient and daughter. From a mobility standpoint ready for discharge when medically appropriate.     Follow Up Recommendations  Follow surgeon's recommendation for DC plan and follow-up therapies     Equipment Recommendations  None recommended by PT    Recommendations for Other Services       Precautions / Restrictions Precautions Precautions: Fall Restrictions Weight Bearing Restrictions: Yes LLE Weight Bearing: Weight bearing as tolerated    Mobility  Bed Mobility Overal bed mobility: Needs Assistance Bed Mobility: Supine to Sit     Supine to sit: Min assist     General bed mobility comments: Min A for L LE to EOB  Transfers Overall transfer level: Needs assistance Equipment used: Rolling walker (2 wheeled) Transfers: Sit to/from Stand Sit to Stand: Min guard         General transfer comment: min guard for safety; no LOB or overt instability  Ambulation/Gait Ambulation/Gait assistance: Supervision Gait Distance (Feet): 500 Feet Assistive device: Rolling walker (2 wheeled) Gait Pattern/deviations: Step-through pattern;Decreased stance time - left;Decreased weight shift to left;Antalgic Gait velocity: decreased   General Gait Details: good step through pattern throughout; improved heel strike with mobility; no LOB    Stairs             Wheelchair Mobility    Modified Rankin (Stroke Patients Only)       Balance Overall balance assessment: Mild deficits observed, not formally  tested                                          Cognition Arousal/Alertness: Awake/alert Behavior During Therapy: WFL for tasks assessed/performed Overall Cognitive Status: Within Functional Limits for tasks assessed                                        Exercises      General Comments        Pertinent Vitals/Pain Pain Assessment: 0-10 Pain Score: 3  Pain Location: L knee Pain Descriptors / Indicators: Aching;Sore Pain Intervention(s): Limited activity within patient's tolerance;Monitored during session;Repositioned;Ice applied    Home Living                      Prior Function            PT Goals (current goals can now be found in the care plan section) Acute Rehab PT Goals Patient Stated Goal: reduce pain PT Goal Formulation: With patient Time For Goal Achievement: 05/14/18 Potential to Achieve Goals: Good Progress towards PT goals: Progressing toward goals    Frequency    7X/week      PT Plan Current plan remains appropriate    Co-evaluation              AM-PAC PT "6 Clicks" Mobility   Outcome Measure  Help  needed turning from your back to your side while in a flat bed without using bedrails?: A Little Help needed moving from lying on your back to sitting on the side of a flat bed without using bedrails?: A Little Help needed moving to and from a bed to a chair (including a wheelchair)?: A Little Help needed standing up from a chair using your arms (e.g., wheelchair or bedside chair)?: A Little Help needed to walk in hospital room?: A Little Help needed climbing 3-5 steps with a railing? : A Little 6 Click Score: 18    End of Session Equipment Utilized During Treatment: Gait belt Activity Tolerance: Patient tolerated treatment well Patient left: in bed;with call bell/phone within reach;with family/visitor present Nurse Communication: Mobility status PT Visit Diagnosis: Unsteadiness on feet  (R26.81);Other abnormalities of gait and mobility (R26.89);Muscle weakness (generalized) (M62.81)     Time: [id number] PT Time Calculation (min) (ACUTE ONLY): 21 min  Charges:  $Gait Training: 8-22 mins                     Kipp LaurenceStephanie R Aaron, PT, DPT Supplemental Physical Therapist 05/01/18 1:25 PM Pager: [phone] Office: [phone]

## 2018-05-01 NOTE — Progress Notes (Signed)
Subjective: 2 Days Post-Op Procedure(s) (LRB): LEFT TOTAL KNEE ARTHROPLASTY (Left) Patient reports pain as mild.  Patient notes some trembling this am, but denies being cold.  No nausea/vomiting, lightheadedness/dizziness, chest pain/pressure/sob.  Tolerating diet.  Objective: Vital signs in last 24 hours: Temp:  [98.6 F (37 C)-98.7 F (37.1 C)] 98.6 F (37 C) (01/08 0403) Pulse Rate:  [83-92] 92 (01/08 0403) Resp:  [18-20] 20 (01/08 0403) BP: (164-167)/(74-90) 164/74 (01/08 0403) SpO2:  [95 %-96 %] 95 % (01/08 0403)  Intake/Output from previous day: 01/07 0701 - 01/08 0700 In: 840 [P.O.:840] Out: -  Intake/Output this shift: No intake/output data recorded.  Recent Labs    04/30/18 0143  HGB 10.1*   Recent Labs    04/30/18 0143  WBC 8.5  RBC 3.28*  HCT 30.1*  PLT 168   Recent Labs    04/30/18 0143  NA 135  K 4.3  CL 103  CO2 25  BUN 22  CREATININE 1.15*  GLUCOSE 171*  CALCIUM 8.4*   No results for input(s): LABPT, INR in the last 72 hours.  Neurologically intact Neurovascular intact Sensation intact distally Intact pulses distally Dorsiflexion/Plantar flexion intact Incision: dressing C/D/I No cellulitis present Compartment soft  Anticipated LOS equal to or greater than 2 midnights due to - Age 75 and older with one or more of the following:  - Obesity  - Expected need for hospital services (PT, OT, Nursing) required for safe  discharge  - Anticipated need for postoperative skilled nursing care or inpatient rehab  - Active co-morbidities: Diabetes OR   - Unanticipated findings during/Post Surgery: Slow post-op progression: GI, pain control, mobility  - Patient is a high risk of re-admission due to: None   Assessment/Plan: 2 Days Post-Op Procedure(s) (LRB): LEFT TOTAL KNEE ARTHROPLASTY (Left) Advance diet Up with therapy Discharge home with home health after first or second PT session (up to patient) WBAT LLE ABLA-mild and  stable    Cristie Hem 05/01/2018, 7:52 AM

## 2018-05-01 NOTE — Progress Notes (Signed)
Patient refused to take asprin stating that she has a stomach ulcer. Provider notified. And new order received for xarelto 10mg  q day and d/c Asprin. Patient and daughter notified.

## 2018-05-02 DIAGNOSIS — Z471 Aftercare following joint replacement surgery: Secondary | ICD-10-CM | POA: Diagnosis not present

## 2018-05-02 DIAGNOSIS — Z7984 Long term (current) use of oral hypoglycemic drugs: Secondary | ICD-10-CM | POA: Diagnosis not present

## 2018-05-02 DIAGNOSIS — I10 Essential (primary) hypertension: Secondary | ICD-10-CM | POA: Diagnosis not present

## 2018-05-02 DIAGNOSIS — M1711 Unilateral primary osteoarthritis, right knee: Secondary | ICD-10-CM | POA: Diagnosis not present

## 2018-05-02 DIAGNOSIS — E119 Type 2 diabetes mellitus without complications: Secondary | ICD-10-CM | POA: Diagnosis not present

## 2018-05-02 DIAGNOSIS — Z96652 Presence of left artificial knee joint: Secondary | ICD-10-CM | POA: Diagnosis not present

## 2018-05-02 DIAGNOSIS — Z7901 Long term (current) use of anticoagulants: Secondary | ICD-10-CM | POA: Diagnosis not present

## 2018-05-06 ENCOUNTER — Telehealth (INDEPENDENT_AMBULATORY_CARE_PROVIDER_SITE_OTHER): Payer: Self-pay | Admitting: Orthopaedic Surgery

## 2018-05-06 NOTE — Telephone Encounter (Signed)
Patient left a message requesting an RX refill on her pain medication.  CB#[phone].  Thank you.

## 2018-05-06 NOTE — Telephone Encounter (Signed)
Gracee, PT, from [facility] left a message requesting VO for [facility] for the following:  1x a week for 1 week 3x a week for 2 weeks  CB#[phone].  Thank you.

## 2018-05-06 NOTE — Telephone Encounter (Signed)
Verbal left on VM for Gracee

## 2018-05-06 NOTE — Telephone Encounter (Signed)
Ok to refill 

## 2018-05-07 NOTE — Telephone Encounter (Signed)
Yes #30 of oxycodone.  5 mg QID prn

## 2018-05-08 DIAGNOSIS — M179 Osteoarthritis of knee, unspecified: Secondary | ICD-10-CM | POA: Diagnosis not present

## 2018-05-08 DIAGNOSIS — R251 Tremor, unspecified: Secondary | ICD-10-CM | POA: Diagnosis not present

## 2018-05-08 MED ORDER — OXYCODONE HCL 5 MG PO TABS: 5.0000 mg | ORAL_TABLET | Freq: Four times a day (QID) | ORAL | 0 refills | Status: DC | PRN

## 2018-05-08 NOTE — Telephone Encounter (Signed)
Patient would like this called into pharm. Please do so.

## 2018-05-14 ENCOUNTER — Encounter (INDEPENDENT_AMBULATORY_CARE_PROVIDER_SITE_OTHER): Payer: Self-pay | Admitting: Orthopaedic Surgery

## 2018-05-14 ENCOUNTER — Ambulatory Visit (INDEPENDENT_AMBULATORY_CARE_PROVIDER_SITE_OTHER): Payer: Medicare Other | Admitting: Physician Assistant

## 2018-05-14 DIAGNOSIS — Z96652 Presence of left artificial knee joint: Secondary | ICD-10-CM

## 2018-05-14 MED ORDER — OXYCODONE-ACETAMINOPHEN 5-325 MG PO TABS: 1.0000 | ORAL_TABLET | Freq: Four times a day (QID) | ORAL | 0 refills | Status: DC | PRN

## 2018-05-14 NOTE — Progress Notes (Signed)
   Post-Op Visit Note   Patient: Robyn [name]           Date of Birth: 03/08/1946           MRN: [id number] Visit Date: 05/14/2018 PCP: Marden Noble, MD   Assessment & Plan:  Chief Complaint:  Chief Complaint  Patient presents with  . Left Knee - Routine Post Op   Visit Diagnoses:  1. S/P TKR (total knee replacement), left     Plan: Patient is a pleasant 73 year old female presents our clinic today 2 weeks status post left total knee replacement, date of surgery 04/29/2018.  She has been doing fairly well.  She notes moderate pain which is relieved with oxycodone.  She has been doing okay with home health physical therapy.  No fevers or chills.  Examination of her left knee shows a well-healed surgical incision without evidence of infection.  Calf is soft and nontender.  Range of motion 0 to 80 degrees.  She is neurovascularly intact distally.  At this point, we will transition her to outpatient physical therapy towards the end of the week once she is finished with her home health therapy sessions.  I have refilled her oxycodone.  Dental prophylaxis reinforced today.  Follow-up with [place] in 4 weeks time for repeat evaluation and x-rays.  Follow-Up Instructions: Return in about 4 weeks (around 06/11/2018).   Orders:  No orders of the defined types were placed in this encounter.  Meds ordered this encounter  Medications  . oxyCODONE-acetaminophen (PERCOCET) 5-325 MG tablet    Sig: Take 1-2 tablets by mouth every 6 (six) hours as needed for severe pain.    Dispense:  30 tablet    Refill:  0    Imaging: No new imaging  PMFS History: Patient Active Problem List   Diagnosis Date Noted  . S/P TKR (total knee replacement), left 04/29/2018  . Primary osteoarthritis of left knee 10/03/2017  . Bilateral primary osteoarthritis of knee 04/30/2017  . Pleuritic chest pain 06/29/2012  . Diabetes mellitus type 2 without retinopathy (HCC) 06/29/2012  . Hypertension 06/29/2012   Past  Medical History:  Diagnosis Date  . Arthritis   . Back pain   . Diabetes mellitus without complication (HCC)   . Heart murmur   . Hypertension     Family History  Problem Relation Age of Onset  . Alzheimer's disease Mother   . Heart attack Father   . Breast cancer Neg Hx     Past Surgical History:  Procedure Laterality Date  . ABDOMINAL HYSTERECTOMY    . BACK SURGERY     L4-L5  . CARPAL TUNNEL RELEASE Bilateral   . TONSILLECTOMY    . TOTAL KNEE ARTHROPLASTY Left 04/29/2018   Procedure: LEFT TOTAL KNEE ARTHROPLASTY;  Surgeon: Tarry Kos, MD;  Location: [facility] OR;  Service: Orthopedics;  Laterality: Left;   Social History   Occupational History  . Not on file  Tobacco Use  . Smoking status: Never Smoker  . Smokeless tobacco: Never Used  Substance and Sexual Activity  . Alcohol use: Yes    Alcohol/week: 3.0 standard drinks    Types: 2 Glasses of wine, 1 Shots of liquor per week    Comment: social drinker  . Drug use: No  . Sexual activity: Never

## 2018-05-19 ENCOUNTER — Other Ambulatory Visit (INDEPENDENT_AMBULATORY_CARE_PROVIDER_SITE_OTHER): Payer: Self-pay | Admitting: Orthopaedic Surgery

## 2018-05-19 ENCOUNTER — Other Ambulatory Visit (HOSPITAL_COMMUNITY): Payer: Self-pay | Admitting: Physician Assistant

## 2018-05-20 DIAGNOSIS — M799 Soft tissue disorder, unspecified: Secondary | ICD-10-CM | POA: Diagnosis not present

## 2018-05-20 DIAGNOSIS — M25562 Pain in left knee: Secondary | ICD-10-CM | POA: Diagnosis not present

## 2018-05-20 DIAGNOSIS — M25462 Effusion, left knee: Secondary | ICD-10-CM | POA: Diagnosis not present

## 2018-05-20 DIAGNOSIS — M25662 Stiffness of left knee, not elsewhere classified: Secondary | ICD-10-CM | POA: Diagnosis not present

## 2018-05-21 DIAGNOSIS — M25662 Stiffness of left knee, not elsewhere classified: Secondary | ICD-10-CM | POA: Diagnosis not present

## 2018-05-21 DIAGNOSIS — M25462 Effusion, left knee: Secondary | ICD-10-CM | POA: Diagnosis not present

## 2018-05-21 DIAGNOSIS — M25562 Pain in left knee: Secondary | ICD-10-CM | POA: Diagnosis not present

## 2018-05-21 DIAGNOSIS — M799 Soft tissue disorder, unspecified: Secondary | ICD-10-CM | POA: Diagnosis not present

## 2018-05-24 ENCOUNTER — Other Ambulatory Visit (HOSPITAL_COMMUNITY): Payer: Self-pay | Admitting: Physician Assistant

## 2018-05-24 DIAGNOSIS — M25562 Pain in left knee: Secondary | ICD-10-CM | POA: Diagnosis not present

## 2018-05-24 DIAGNOSIS — M799 Soft tissue disorder, unspecified: Secondary | ICD-10-CM | POA: Diagnosis not present

## 2018-05-24 DIAGNOSIS — M25462 Effusion, left knee: Secondary | ICD-10-CM | POA: Diagnosis not present

## 2018-05-24 DIAGNOSIS — M25662 Stiffness of left knee, not elsewhere classified: Secondary | ICD-10-CM | POA: Diagnosis not present

## 2018-05-28 DIAGNOSIS — M25662 Stiffness of left knee, not elsewhere classified: Secondary | ICD-10-CM | POA: Diagnosis not present

## 2018-05-28 DIAGNOSIS — M25462 Effusion, left knee: Secondary | ICD-10-CM | POA: Diagnosis not present

## 2018-05-28 DIAGNOSIS — M799 Soft tissue disorder, unspecified: Secondary | ICD-10-CM | POA: Diagnosis not present

## 2018-05-28 DIAGNOSIS — M25562 Pain in left knee: Secondary | ICD-10-CM | POA: Diagnosis not present

## 2018-05-29 DIAGNOSIS — M25462 Effusion, left knee: Secondary | ICD-10-CM | POA: Diagnosis not present

## 2018-05-29 DIAGNOSIS — M799 Soft tissue disorder, unspecified: Secondary | ICD-10-CM | POA: Diagnosis not present

## 2018-05-29 DIAGNOSIS — M25562 Pain in left knee: Secondary | ICD-10-CM | POA: Diagnosis not present

## 2018-05-29 DIAGNOSIS — M25662 Stiffness of left knee, not elsewhere classified: Secondary | ICD-10-CM | POA: Diagnosis not present

## 2018-05-31 DIAGNOSIS — M799 Soft tissue disorder, unspecified: Secondary | ICD-10-CM | POA: Diagnosis not present

## 2018-05-31 DIAGNOSIS — M25462 Effusion, left knee: Secondary | ICD-10-CM | POA: Diagnosis not present

## 2018-05-31 DIAGNOSIS — M25562 Pain in left knee: Secondary | ICD-10-CM | POA: Diagnosis not present

## 2018-05-31 DIAGNOSIS — M25662 Stiffness of left knee, not elsewhere classified: Secondary | ICD-10-CM | POA: Diagnosis not present

## 2018-06-03 ENCOUNTER — Telehealth (INDEPENDENT_AMBULATORY_CARE_PROVIDER_SITE_OTHER): Payer: Self-pay | Admitting: Orthopaedic Surgery

## 2018-06-03 DIAGNOSIS — M25662 Stiffness of left knee, not elsewhere classified: Secondary | ICD-10-CM | POA: Diagnosis not present

## 2018-06-03 DIAGNOSIS — M25562 Pain in left knee: Secondary | ICD-10-CM | POA: Diagnosis not present

## 2018-06-03 DIAGNOSIS — M799 Soft tissue disorder, unspecified: Secondary | ICD-10-CM | POA: Diagnosis not present

## 2018-06-03 DIAGNOSIS — M25462 Effusion, left knee: Secondary | ICD-10-CM | POA: Diagnosis not present

## 2018-06-03 NOTE — Telephone Encounter (Signed)
Pt is s/p a left total knee 04/29/2018 she is calling for a refill on her Xarelto do you want her to continue with this?

## 2018-06-03 NOTE — Telephone Encounter (Signed)
Called and lm on vm to advise of message below.  

## 2018-06-03 NOTE — Telephone Encounter (Signed)
Patient called and requested Xarelto refill.   Please call patient to advise.  Call into to pharmacy on file @ [address] on [address]

## 2018-06-03 NOTE — Telephone Encounter (Signed)
She no longer needs the xarelto. Thanks

## 2018-06-04 DIAGNOSIS — M25562 Pain in left knee: Secondary | ICD-10-CM | POA: Diagnosis not present

## 2018-06-04 DIAGNOSIS — M799 Soft tissue disorder, unspecified: Secondary | ICD-10-CM | POA: Diagnosis not present

## 2018-06-04 DIAGNOSIS — M25462 Effusion, left knee: Secondary | ICD-10-CM | POA: Diagnosis not present

## 2018-06-04 DIAGNOSIS — M25662 Stiffness of left knee, not elsewhere classified: Secondary | ICD-10-CM | POA: Diagnosis not present

## 2018-06-07 DIAGNOSIS — M25662 Stiffness of left knee, not elsewhere classified: Secondary | ICD-10-CM | POA: Diagnosis not present

## 2018-06-07 DIAGNOSIS — M799 Soft tissue disorder, unspecified: Secondary | ICD-10-CM | POA: Diagnosis not present

## 2018-06-07 DIAGNOSIS — M25462 Effusion, left knee: Secondary | ICD-10-CM | POA: Diagnosis not present

## 2018-06-07 DIAGNOSIS — M25562 Pain in left knee: Secondary | ICD-10-CM | POA: Diagnosis not present

## 2018-06-10 DIAGNOSIS — M799 Soft tissue disorder, unspecified: Secondary | ICD-10-CM | POA: Diagnosis not present

## 2018-06-10 DIAGNOSIS — M25462 Effusion, left knee: Secondary | ICD-10-CM | POA: Diagnosis not present

## 2018-06-10 DIAGNOSIS — M25562 Pain in left knee: Secondary | ICD-10-CM | POA: Diagnosis not present

## 2018-06-10 DIAGNOSIS — M25662 Stiffness of left knee, not elsewhere classified: Secondary | ICD-10-CM | POA: Diagnosis not present

## 2018-06-11 ENCOUNTER — Encounter (INDEPENDENT_AMBULATORY_CARE_PROVIDER_SITE_OTHER): Payer: Self-pay | Admitting: Orthopaedic Surgery

## 2018-06-11 ENCOUNTER — Ambulatory Visit (INDEPENDENT_AMBULATORY_CARE_PROVIDER_SITE_OTHER): Payer: Medicare Other | Admitting: Physician Assistant

## 2018-06-11 ENCOUNTER — Ambulatory Visit (INDEPENDENT_AMBULATORY_CARE_PROVIDER_SITE_OTHER): Payer: Medicare Other

## 2018-06-11 DIAGNOSIS — M25562 Pain in left knee: Secondary | ICD-10-CM | POA: Diagnosis not present

## 2018-06-11 DIAGNOSIS — M799 Soft tissue disorder, unspecified: Secondary | ICD-10-CM | POA: Diagnosis not present

## 2018-06-11 DIAGNOSIS — Z96652 Presence of left artificial knee joint: Secondary | ICD-10-CM

## 2018-06-11 DIAGNOSIS — M25662 Stiffness of left knee, not elsewhere classified: Secondary | ICD-10-CM | POA: Diagnosis not present

## 2018-06-11 DIAGNOSIS — M25462 Effusion, left knee: Secondary | ICD-10-CM | POA: Diagnosis not present

## 2018-06-11 NOTE — Progress Notes (Signed)
   Post-Op Visit Note   Patient: Robyn [name]           Date of Birth: March 04, 1946           MRN: [id number] Visit Date: 06/11/2018 PCP: Marden Noble, MD   Assessment & Plan:  Chief Complaint:  Chief Complaint  Patient presents with  . Left Knee - Pain   Visit Diagnoses:  1. S/P TKR (total knee replacement), left     Plan: Patient is a pleasant 73 year old female presents our clinic today 43 days status post left total knee replacement, date of surgery 04/29/2018.  She has been doing very well.  She has been in formal physical therapy making fair progress.  She has been taking Tylenol as needed for the pain.  Overall doing well.  Examination of left knee reveals well-healed surgical incision without evidence of infection or cellulitis.  Range of motion 0 to 90 degrees.  Calf is soft nontender.  She is stable to valgus varus stress.  She is neurovascular intact distally.  At this point, she will push it for the next 4 weeks at home and in formal therapy.  We did discuss manipulation should she not make progress and plateau.  Follow-up with [place] in 4 weeks time for recheck  Follow-Up Instructions: Return in about 4 weeks (around 07/09/2018).   Orders:  Orders Placed This Encounter  Procedures  . XR KNEE 3 VIEW LEFT   No orders of the defined types were placed in this encounter.   Imaging: Xr Knee 3 View Left  Result Date: 06/11/2018 Stable alignment of the prosthesis   PMFS History: Patient Active Problem List   Diagnosis Date Noted  . S/P TKR (total knee replacement), left 04/29/2018  . Primary osteoarthritis of left knee 10/03/2017  . Bilateral primary osteoarthritis of knee 04/30/2017  . Pleuritic chest pain 06/29/2012  . Diabetes mellitus type 2 without retinopathy (HCC) 06/29/2012  . Hypertension 06/29/2012   Past Medical History:  Diagnosis Date  . Arthritis   . Back pain   . Diabetes mellitus without complication (HCC)   . Heart murmur   . Hypertension       Family History  Problem Relation Age of Onset  . Alzheimer's disease Mother   . Heart attack Father   . Breast cancer Neg Hx     Past Surgical History:  Procedure Laterality Date  . ABDOMINAL HYSTERECTOMY    . BACK SURGERY     L4-L5  . CARPAL TUNNEL RELEASE Bilateral   . TONSILLECTOMY    . TOTAL KNEE ARTHROPLASTY Left 04/29/2018   Procedure: LEFT TOTAL KNEE ARTHROPLASTY;  Surgeon: Tarry Kos, MD;  Location: [facility] OR;  Service: Orthopedics;  Laterality: Left;   Social History   Occupational History  . Not on file  Tobacco Use  . Smoking status: Never Smoker  . Smokeless tobacco: Never Used  Substance and Sexual Activity  . Alcohol use: Yes    Alcohol/week: 3.0 standard drinks    Types: 2 Glasses of wine, 1 Shots of liquor per week    Comment: social drinker  . Drug use: No  . Sexual activity: Never

## 2018-06-14 DIAGNOSIS — M799 Soft tissue disorder, unspecified: Secondary | ICD-10-CM | POA: Diagnosis not present

## 2018-06-14 DIAGNOSIS — M25662 Stiffness of left knee, not elsewhere classified: Secondary | ICD-10-CM | POA: Diagnosis not present

## 2018-06-14 DIAGNOSIS — M25462 Effusion, left knee: Secondary | ICD-10-CM | POA: Diagnosis not present

## 2018-06-14 DIAGNOSIS — M25562 Pain in left knee: Secondary | ICD-10-CM | POA: Diagnosis not present

## 2018-06-17 DIAGNOSIS — M25562 Pain in left knee: Secondary | ICD-10-CM | POA: Diagnosis not present

## 2018-06-17 DIAGNOSIS — M25462 Effusion, left knee: Secondary | ICD-10-CM | POA: Diagnosis not present

## 2018-06-17 DIAGNOSIS — M799 Soft tissue disorder, unspecified: Secondary | ICD-10-CM | POA: Diagnosis not present

## 2018-06-17 DIAGNOSIS — M25662 Stiffness of left knee, not elsewhere classified: Secondary | ICD-10-CM | POA: Diagnosis not present

## 2018-06-18 DIAGNOSIS — M25562 Pain in left knee: Secondary | ICD-10-CM | POA: Diagnosis not present

## 2018-06-18 DIAGNOSIS — M25662 Stiffness of left knee, not elsewhere classified: Secondary | ICD-10-CM | POA: Diagnosis not present

## 2018-06-18 DIAGNOSIS — M25462 Effusion, left knee: Secondary | ICD-10-CM | POA: Diagnosis not present

## 2018-06-18 DIAGNOSIS — M799 Soft tissue disorder, unspecified: Secondary | ICD-10-CM | POA: Diagnosis not present

## 2018-06-21 DIAGNOSIS — M25662 Stiffness of left knee, not elsewhere classified: Secondary | ICD-10-CM | POA: Diagnosis not present

## 2018-06-21 DIAGNOSIS — M25562 Pain in left knee: Secondary | ICD-10-CM | POA: Diagnosis not present

## 2018-06-21 DIAGNOSIS — M25462 Effusion, left knee: Secondary | ICD-10-CM | POA: Diagnosis not present

## 2018-06-21 DIAGNOSIS — M799 Soft tissue disorder, unspecified: Secondary | ICD-10-CM | POA: Diagnosis not present

## 2018-06-24 DIAGNOSIS — M25462 Effusion, left knee: Secondary | ICD-10-CM | POA: Diagnosis not present

## 2018-06-24 DIAGNOSIS — M25562 Pain in left knee: Secondary | ICD-10-CM | POA: Diagnosis not present

## 2018-06-24 DIAGNOSIS — M25662 Stiffness of left knee, not elsewhere classified: Secondary | ICD-10-CM | POA: Diagnosis not present

## 2018-06-24 DIAGNOSIS — M799 Soft tissue disorder, unspecified: Secondary | ICD-10-CM | POA: Diagnosis not present

## 2018-06-26 DIAGNOSIS — M25662 Stiffness of left knee, not elsewhere classified: Secondary | ICD-10-CM | POA: Diagnosis not present

## 2018-06-26 DIAGNOSIS — M25562 Pain in left knee: Secondary | ICD-10-CM | POA: Diagnosis not present

## 2018-06-26 DIAGNOSIS — M25462 Effusion, left knee: Secondary | ICD-10-CM | POA: Diagnosis not present

## 2018-06-26 DIAGNOSIS — M799 Soft tissue disorder, unspecified: Secondary | ICD-10-CM | POA: Diagnosis not present

## 2018-07-01 DIAGNOSIS — M25662 Stiffness of left knee, not elsewhere classified: Secondary | ICD-10-CM | POA: Diagnosis not present

## 2018-07-01 DIAGNOSIS — M25462 Effusion, left knee: Secondary | ICD-10-CM | POA: Diagnosis not present

## 2018-07-01 DIAGNOSIS — M799 Soft tissue disorder, unspecified: Secondary | ICD-10-CM | POA: Diagnosis not present

## 2018-07-01 DIAGNOSIS — M25562 Pain in left knee: Secondary | ICD-10-CM | POA: Diagnosis not present

## 2018-07-03 DIAGNOSIS — M25462 Effusion, left knee: Secondary | ICD-10-CM | POA: Diagnosis not present

## 2018-07-03 DIAGNOSIS — M799 Soft tissue disorder, unspecified: Secondary | ICD-10-CM | POA: Diagnosis not present

## 2018-07-03 DIAGNOSIS — M25562 Pain in left knee: Secondary | ICD-10-CM | POA: Diagnosis not present

## 2018-07-03 DIAGNOSIS — M25662 Stiffness of left knee, not elsewhere classified: Secondary | ICD-10-CM | POA: Diagnosis not present

## 2018-07-08 DIAGNOSIS — M799 Soft tissue disorder, unspecified: Secondary | ICD-10-CM | POA: Diagnosis not present

## 2018-07-08 DIAGNOSIS — M25662 Stiffness of left knee, not elsewhere classified: Secondary | ICD-10-CM | POA: Diagnosis not present

## 2018-07-08 DIAGNOSIS — M25562 Pain in left knee: Secondary | ICD-10-CM | POA: Diagnosis not present

## 2018-07-08 DIAGNOSIS — M25462 Effusion, left knee: Secondary | ICD-10-CM | POA: Diagnosis not present

## 2018-07-09 ENCOUNTER — Other Ambulatory Visit: Payer: Self-pay

## 2018-07-09 ENCOUNTER — Ambulatory Visit (INDEPENDENT_AMBULATORY_CARE_PROVIDER_SITE_OTHER): Payer: Medicare Other | Admitting: Physician Assistant

## 2018-07-09 ENCOUNTER — Encounter (INDEPENDENT_AMBULATORY_CARE_PROVIDER_SITE_OTHER): Payer: Self-pay | Admitting: Orthopaedic Surgery

## 2018-07-09 VITALS — Ht 63.0 in | Wt 167.0 lb

## 2018-07-09 DIAGNOSIS — Z96652 Presence of left artificial knee joint: Secondary | ICD-10-CM

## 2018-07-09 NOTE — Progress Notes (Signed)
   Post-Op Visit Note   Patient: Robyn [name]           Date of Birth: 10-26-1945           MRN: [id number] Visit Date: 07/09/2018 PCP: Marden Noble, MD   Assessment & Plan:  Chief Complaint:  Chief Complaint  Patient presents with  . Left Knee - Routine Post Op    TKA 04/29/2018   Visit Diagnoses:  1. S/P TKR (total knee replacement), left     Plan: Patient is a pleasant 73 year old female who presents to our clinic today 71 days status post left total knee replacement, date of surgery 04/29/2018.  She has been doing well with some soreness.  She has been working hard in physical therapy where she has not made much improvement with flexion of the knee.  Examination of the left knee reveals well-healed surgical incision.  Range of motion from about 2 to 90 degrees of flexion.  Stable valgus varus stress.  She is neurovascularly intact distally.  At this point, due to the lack of progress with knee flexion and formal physical therapy, we have recommended manipulation under anesthesia.  The patient does not want to proceed with this.  We have also discussed with her that she is in the window of opportunity for this and once she returns for her follow-up appointment in 3 months, she will be out of this window.  She will continue with aggressive formal physical therapy in the meantime, follow-up with [place] in 3 months time for recheck.  Follow-Up Instructions: Return in about 3 months (around 10/09/2018).   Orders:  No orders of the defined types were placed in this encounter.  No orders of the defined types were placed in this encounter.   Imaging: No new imaging  PMFS History: Patient Active Problem List   Diagnosis Date Noted  . S/P TKR (total knee replacement), left 04/29/2018  . Primary osteoarthritis of left knee 10/03/2017  . Bilateral primary osteoarthritis of knee 04/30/2017  . Pleuritic chest pain 06/29/2012  . Diabetes mellitus type 2 without retinopathy (HCC) 06/29/2012   . Hypertension 06/29/2012   Past Medical History:  Diagnosis Date  . Arthritis   . Back pain   . Diabetes mellitus without complication (HCC)   . Heart murmur   . Hypertension     Family History  Problem Relation Age of Onset  . Alzheimer's disease Mother   . Heart attack Father   . Breast cancer Neg Hx     Past Surgical History:  Procedure Laterality Date  . ABDOMINAL HYSTERECTOMY    . BACK SURGERY     L4-L5  . CARPAL TUNNEL RELEASE Bilateral   . TONSILLECTOMY    . TOTAL KNEE ARTHROPLASTY Left 04/29/2018   Procedure: LEFT TOTAL KNEE ARTHROPLASTY;  Surgeon: Tarry Kos, MD;  Location: [facility] OR;  Service: Orthopedics;  Laterality: Left;   Social History   Occupational History  . Not on file  Tobacco Use  . Smoking status: Never Smoker  . Smokeless tobacco: Never Used  Substance and Sexual Activity  . Alcohol use: Yes    Alcohol/week: 3.0 standard drinks    Types: 2 Glasses of wine, 1 Shots of liquor per week    Comment: social drinker  . Drug use: No  . Sexual activity: Never

## 2018-07-10 DIAGNOSIS — M25462 Effusion, left knee: Secondary | ICD-10-CM | POA: Diagnosis not present

## 2018-07-10 DIAGNOSIS — M25562 Pain in left knee: Secondary | ICD-10-CM | POA: Diagnosis not present

## 2018-07-10 DIAGNOSIS — M799 Soft tissue disorder, unspecified: Secondary | ICD-10-CM | POA: Diagnosis not present

## 2018-07-10 DIAGNOSIS — M25662 Stiffness of left knee, not elsewhere classified: Secondary | ICD-10-CM | POA: Diagnosis not present

## 2018-07-11 ENCOUNTER — Telehealth (INDEPENDENT_AMBULATORY_CARE_PROVIDER_SITE_OTHER): Payer: Self-pay | Admitting: Orthopaedic Surgery

## 2018-07-11 DIAGNOSIS — E1140 Type 2 diabetes mellitus with diabetic neuropathy, unspecified: Secondary | ICD-10-CM | POA: Diagnosis not present

## 2018-07-11 DIAGNOSIS — K2980 Duodenitis without bleeding: Secondary | ICD-10-CM | POA: Diagnosis not present

## 2018-07-11 DIAGNOSIS — K2930 Chronic superficial gastritis without bleeding: Secondary | ICD-10-CM | POA: Diagnosis not present

## 2018-07-11 DIAGNOSIS — M179 Osteoarthritis of knee, unspecified: Secondary | ICD-10-CM | POA: Diagnosis not present

## 2018-07-11 NOTE — Telephone Encounter (Signed)
See message.

## 2018-07-11 NOTE — Telephone Encounter (Signed)
Ok please post for wed at [facility] for manipulation.  CPM for home.  Please have patient set up for outpatient PT 1 day after manipulation.  Thanks.

## 2018-07-11 NOTE — Telephone Encounter (Signed)
Pt called in said she came in on Tuesday to see Dr.Xu and they discussed doing a surgery for scar tissue removal and the pt decided she would like to go through with it now.  [phone]

## 2018-07-12 ENCOUNTER — Other Ambulatory Visit (INDEPENDENT_AMBULATORY_CARE_PROVIDER_SITE_OTHER): Payer: Self-pay

## 2018-07-12 ENCOUNTER — Encounter (HOSPITAL_BASED_OUTPATIENT_CLINIC_OR_DEPARTMENT_OTHER): Payer: Self-pay | Admitting: *Deleted

## 2018-07-12 ENCOUNTER — Other Ambulatory Visit: Payer: Self-pay

## 2018-07-12 DIAGNOSIS — Z96652 Presence of left artificial knee joint: Secondary | ICD-10-CM

## 2018-07-12 NOTE — Telephone Encounter (Signed)
I called patient and left voice mail for return call. °

## 2018-07-12 NOTE — Telephone Encounter (Signed)
Thanks

## 2018-07-12 NOTE — Telephone Encounter (Signed)
Did you take care of [facility]?

## 2018-07-12 NOTE — Telephone Encounter (Signed)
Referral sent to [facility]

## 2018-07-12 NOTE — Telephone Encounter (Signed)
Patient called back.  Gave instructions for surgery and post op.

## 2018-07-12 NOTE — Telephone Encounter (Signed)
yes

## 2018-07-15 ENCOUNTER — Other Ambulatory Visit: Payer: Self-pay

## 2018-07-15 ENCOUNTER — Encounter (HOSPITAL_BASED_OUTPATIENT_CLINIC_OR_DEPARTMENT_OTHER)
Admission: RE | Admit: 2018-07-15 | Discharge: 2018-07-15 | Disposition: A | Discharge status: Home / Self Care | Payer: Medicare Other | Source: Ambulatory Visit | Attending: Orthopaedic Surgery | Admitting: Orthopaedic Surgery

## 2018-07-15 DIAGNOSIS — M24662 Ankylosis, left knee: Secondary | ICD-10-CM | POA: Diagnosis not present

## 2018-07-15 DIAGNOSIS — Z79899 Other long term (current) drug therapy: Secondary | ICD-10-CM | POA: Diagnosis not present

## 2018-07-15 DIAGNOSIS — E119 Type 2 diabetes mellitus without complications: Secondary | ICD-10-CM | POA: Diagnosis not present

## 2018-07-15 DIAGNOSIS — Z01812 Encounter for preprocedural laboratory examination: Secondary | ICD-10-CM

## 2018-07-15 DIAGNOSIS — Z96652 Presence of left artificial knee joint: Secondary | ICD-10-CM | POA: Diagnosis not present

## 2018-07-15 DIAGNOSIS — Z7984 Long term (current) use of oral hypoglycemic drugs: Secondary | ICD-10-CM | POA: Diagnosis not present

## 2018-07-15 DIAGNOSIS — K219 Gastro-esophageal reflux disease without esophagitis: Secondary | ICD-10-CM | POA: Diagnosis not present

## 2018-07-15 DIAGNOSIS — I10 Essential (primary) hypertension: Secondary | ICD-10-CM | POA: Diagnosis not present

## 2018-07-15 LAB — BASIC METABOLIC PANEL
Anion gap: 8 (ref 5–15)
BUN: 33 mg/dL — ABNORMAL HIGH (ref 8–23)
CO2: 26 mmol/L (ref 22–32)
Calcium: 9.7 mg/dL (ref 8.9–10.3)
Chloride: 104 mmol/L (ref 98–111)
Creatinine, Ser: 1.3 mg/dL — ABNORMAL HIGH (ref 0.44–1.00)
GFR calc Af Amer: 47 mL/min — ABNORMAL LOW (ref >60)
GFR calc non Af Amer: 40 mL/min — ABNORMAL LOW (ref >60)
GLUCOSE: 194 mg/dL — AB (ref 70–99)
Potassium: 4.3 mmol/L (ref 3.5–5.1)
Sodium: 138 mmol/L (ref 135–145)

## 2018-07-16 NOTE — Progress Notes (Signed)
Lab results reviewed with Dr Armond Hang, continue with surgery as scheduled.

## 2018-07-17 ENCOUNTER — Ambulatory Visit (HOSPITAL_COMMUNITY): Payer: Medicare Other

## 2018-07-17 ENCOUNTER — Ambulatory Visit (HOSPITAL_BASED_OUTPATIENT_CLINIC_OR_DEPARTMENT_OTHER): Payer: Medicare Other | Admitting: Anesthesiology

## 2018-07-17 ENCOUNTER — Ambulatory Visit (HOSPITAL_BASED_OUTPATIENT_CLINIC_OR_DEPARTMENT_OTHER)
Admission: RE | Admit: 2018-07-17 | Discharge: 2018-07-17 | Disposition: A | Discharge status: Home / Self Care | Payer: Medicare Other | Attending: Orthopaedic Surgery | Admitting: Orthopaedic Surgery

## 2018-07-17 ENCOUNTER — Telehealth (INDEPENDENT_AMBULATORY_CARE_PROVIDER_SITE_OTHER): Payer: Self-pay | Admitting: Orthopaedic Surgery

## 2018-07-17 ENCOUNTER — Encounter (HOSPITAL_BASED_OUTPATIENT_CLINIC_OR_DEPARTMENT_OTHER): Payer: Self-pay | Admitting: *Deleted

## 2018-07-17 ENCOUNTER — Encounter (HOSPITAL_BASED_OUTPATIENT_CLINIC_OR_DEPARTMENT_OTHER)
Admission: RE | Disposition: A | Discharge status: Home / Self Care | Payer: Self-pay | Source: Home / Self Care | Attending: Orthopaedic Surgery

## 2018-07-17 ENCOUNTER — Other Ambulatory Visit: Payer: Self-pay

## 2018-07-17 DIAGNOSIS — M24662 Ankylosis, left knee: Secondary | ICD-10-CM | POA: Insufficient documentation

## 2018-07-17 DIAGNOSIS — Z79899 Other long term (current) drug therapy: Secondary | ICD-10-CM | POA: Diagnosis not present

## 2018-07-17 DIAGNOSIS — Z7984 Long term (current) use of oral hypoglycemic drugs: Secondary | ICD-10-CM | POA: Diagnosis not present

## 2018-07-17 DIAGNOSIS — Z96659 Presence of unspecified artificial knee joint: Secondary | ICD-10-CM

## 2018-07-17 DIAGNOSIS — Z96652 Presence of left artificial knee joint: Secondary | ICD-10-CM | POA: Diagnosis not present

## 2018-07-17 DIAGNOSIS — K219 Gastro-esophageal reflux disease without esophagitis: Secondary | ICD-10-CM | POA: Insufficient documentation

## 2018-07-17 DIAGNOSIS — I10 Essential (primary) hypertension: Secondary | ICD-10-CM | POA: Insufficient documentation

## 2018-07-17 DIAGNOSIS — E119 Type 2 diabetes mellitus without complications: Secondary | ICD-10-CM | POA: Insufficient documentation

## 2018-07-17 DIAGNOSIS — T8482XA Fibrosis due to internal orthopedic prosthetic devices, implants and grafts, initial encounter: Secondary | ICD-10-CM | POA: Diagnosis not present

## 2018-07-17 HISTORY — PX: KNEE CLOSED REDUCTION: SHX995

## 2018-07-17 HISTORY — DX: Gastro-esophageal reflux disease without esophagitis: K21.9

## 2018-07-17 LAB — GLUCOSE, CAPILLARY: Glucose-Capillary: 170 mg/dL — ABNORMAL HIGH (ref 70–99)

## 2018-07-17 SURGERY — MANIPULATION, KNEE, CLOSED
Anesthesia: General | Site: Knee | Laterality: Left

## 2018-07-17 MED ORDER — LIDOCAINE HCL (CARDIAC) PF 100 MG/5ML IV SOSY
PREFILLED_SYRINGE | INTRAVENOUS | Status: DC | PRN
  Administered 2018-07-17: 20 mg via INTRAVENOUS

## 2018-07-17 MED ORDER — ONDANSETRON HCL 4 MG/2ML IJ SOLN
INTRAMUSCULAR | Status: AC
  Filled 2018-07-17: qty 2

## 2018-07-17 MED ORDER — PROPOFOL 10 MG/ML IV BOLUS
INTRAVENOUS | Status: DC | PRN
  Administered 2018-07-17: 20 mg via INTRAVENOUS
  Administered 2018-07-17: 10 mg via INTRAVENOUS
  Administered 2018-07-17 (×3): 20 mg via INTRAVENOUS
  Administered 2018-07-17: 10 mg via INTRAVENOUS

## 2018-07-17 MED ORDER — HYDROCODONE-ACETAMINOPHEN 5-325 MG PO TABS: 1.0000 | ORAL_TABLET | Freq: Two times a day (BID) | ORAL | 0 refills | Status: AC | PRN

## 2018-07-17 MED ORDER — BUPIVACAINE HCL (PF) 0.5 % IJ SOLN
INTRAMUSCULAR | Status: DC | PRN
  Administered 2018-07-17: 10 mL

## 2018-07-17 MED ORDER — OXYCODONE HCL 5 MG PO TABS: 5.0000 mg | ORAL_TABLET | Freq: Once | ORAL | Status: DC | PRN

## 2018-07-17 MED ORDER — CHLORHEXIDINE GLUCONATE 4 % EX LIQD: 60.0000 mL | Freq: Once | CUTANEOUS | Status: DC

## 2018-07-17 MED ORDER — PROMETHAZINE HCL 25 MG/ML IJ SOLN: 6.2500 mg | INTRAMUSCULAR | Status: DC | PRN

## 2018-07-17 MED ORDER — FENTANYL CITRATE (PF) 100 MCG/2ML IJ SOLN: 50.0000 ug | INTRAMUSCULAR | Status: DC | PRN

## 2018-07-17 MED ORDER — SCOPOLAMINE 1 MG/3DAYS TD PT72: 1.0000 | MEDICATED_PATCH | Freq: Once | TRANSDERMAL | Status: DC | PRN

## 2018-07-17 MED ORDER — LACTATED RINGERS IV SOLN: INTRAVENOUS | Status: DC

## 2018-07-17 MED ORDER — HYDROMORPHONE HCL 1 MG/ML IJ SOLN: 0.2500 mg | INTRAMUSCULAR | Status: DC | PRN

## 2018-07-17 MED ORDER — DEXAMETHASONE SODIUM PHOSPHATE 10 MG/ML IJ SOLN
INTRAMUSCULAR | Status: AC
  Filled 2018-07-17: qty 1

## 2018-07-17 MED ORDER — ONDANSETRON HCL 4 MG/2ML IJ SOLN
INTRAMUSCULAR | Status: DC | PRN
  Administered 2018-07-17: 4 mg via INTRAVENOUS

## 2018-07-17 MED ORDER — ONDANSETRON HCL 4 MG PO TABS: 4.0000 mg | ORAL_TABLET | Freq: Three times a day (TID) | ORAL | 0 refills | Status: AC | PRN

## 2018-07-17 MED ORDER — LACTATED RINGERS IV SOLN
INTRAVENOUS | Status: DC
  Administered 2018-07-17: 08:00:00 via INTRAVENOUS

## 2018-07-17 MED ORDER — FENTANYL CITRATE (PF) 100 MCG/2ML IJ SOLN
INTRAMUSCULAR | Status: DC | PRN
  Administered 2018-07-17: 100 ug via INTRAVENOUS

## 2018-07-17 MED ORDER — OXYCODONE HCL 5 MG/5ML PO SOLN: 5.0000 mg | Freq: Once | ORAL | Status: DC | PRN

## 2018-07-17 MED ORDER — FENTANYL CITRATE (PF) 100 MCG/2ML IJ SOLN
INTRAMUSCULAR | Status: AC
  Filled 2018-07-17: qty 2

## 2018-07-17 MED ORDER — PROPOFOL 10 MG/ML IV BOLUS
INTRAVENOUS | Status: AC
  Filled 2018-07-17: qty 20

## 2018-07-17 MED ORDER — MIDAZOLAM HCL 2 MG/2ML IJ SOLN: 1.0000 mg | INTRAMUSCULAR | Status: DC | PRN

## 2018-07-17 MED ORDER — LIDOCAINE 2% (20 MG/ML) 5 ML SYRINGE
INTRAMUSCULAR | Status: AC
  Filled 2018-07-17: qty 5

## 2018-07-17 SURGICAL SUPPLY — 14 items
GLOVE BIOGEL PI IND STRL 7.0 (GLOVE) IMPLANT
GLOVE BIOGEL PI INDICATOR 7.0 (GLOVE)
GLOVE SKINSENSE NS SZ7.5 (GLOVE)
GLOVE SKINSENSE STRL SZ7.5 (GLOVE) IMPLANT
GOWN STRL REIN XL XLG (GOWN DISPOSABLE) ×1 IMPLANT
GOWN STRL REUS W/ TWL XL LVL3 (GOWN DISPOSABLE) ×1 IMPLANT
GOWN STRL REUS W/TWL XL LVL3 (GOWN DISPOSABLE)
NDL SAFETY ECLIPSE 18X1.5 (NEEDLE) ×1 IMPLANT
NEEDLE HYPO 18GX1.5 SHARP (NEEDLE) ×2
NEEDLE HYPO 22GX1.5 SAFETY (NEEDLE) ×2 IMPLANT
PAD ALCOHOL SWAB (MISCELLANEOUS) ×10 IMPLANT
SYR 20CC LL (SYRINGE) ×1 IMPLANT
SYR CONTROL 10ML LL (SYRINGE) ×2 IMPLANT
TOWEL GREEN STERILE FF (TOWEL DISPOSABLE) ×2 IMPLANT

## 2018-07-17 NOTE — Anesthesia Preprocedure Evaluation (Signed)
Anesthesia Evaluation  Patient identified by MRN, date of birth, ID band Patient awake    Reviewed: Allergy & Precautions, NPO status , Patient's Chart, lab work & pertinent test results  Airway Mallampati: I       Dental  (+) Missing, Poor Dentition,    Pulmonary    Pulmonary exam normal breath sounds clear to auscultation       Cardiovascular hypertension, Pt. on medications Normal cardiovascular exam Rhythm:Regular Rate:Normal     Neuro/Psych    GI/Hepatic GERD  Medicated and Controlled,  Endo/Other  diabetes, Type 2, Oral Hypoglycemic Agents  Renal/GU      Musculoskeletal  (+) Arthritis , Osteoarthritis,    Abdominal Normal abdominal exam  (+)   Peds  Hematology negative hematology ROS (+)   Anesthesia Other Findings   Reproductive/Obstetrics                             Anesthesia Physical  Anesthesia Plan  ASA: III  Anesthesia Plan: General   Post-op Pain Management:    Induction: Intravenous  PONV Risk Score and Plan: 3 and Ondansetron, Treatment may vary due to age or medical condition, Propofol infusion and Midazolam  Airway Management Planned: Mask  Additional Equipment:   Intra-op Plan:   Post-operative Plan:   Informed Consent: I have reviewed the patients History and Physical, chart, labs and discussed the procedure including the risks, benefits and alternatives for the proposed anesthesia with the patient or authorized representative who has indicated his/her understanding and acceptance.       Plan Discussed with: CRNA and Surgeon  Anesthesia Plan Comments:         Anesthesia Quick Evaluation

## 2018-07-17 NOTE — H&P (Signed)
PREOPERATIVE H&P  Chief Complaint: arthrofibrosis left knee  HPI: Robyn [name] is a 73 y.o. female who presents for surgical treatment of arthrofibrosis left knee.  She denies any changes in medical history.  Past Medical History:  Diagnosis Date  . Arthritis   . Back pain   . Diabetes mellitus without complication (HCC)   . GERD (gastroesophageal reflux disease)   . Heart murmur   . Hypertension    Past Surgical History:  Procedure Laterality Date  . ABDOMINAL HYSTERECTOMY    . BACK SURGERY     L4-L5  . CARPAL TUNNEL RELEASE Bilateral   . TONSILLECTOMY    . TOTAL KNEE ARTHROPLASTY Left 04/29/2018   Procedure: LEFT TOTAL KNEE ARTHROPLASTY;  Surgeon: Tarry Kos, MD;  Location: [facility];  Service: Orthopedics;  Laterality: Left;   Social History   Socioeconomic History  . Marital status: Married    Spouse name: Not on file  . Number of children: Not on file  . Years of education: Not on file  . Highest education level: Not on file  Occupational History  . Not on file  Social Needs  . Financial resource strain: Not on file  . Food insecurity:    Worry: Not on file    Inability: Not on file  . Transportation needs:    Medical: Not on file    Non-medical: Not on file  Tobacco Use  . Smoking status: Never Smoker  . Smokeless tobacco: Never Used  Substance and Sexual Activity  . Alcohol use: Yes    Alcohol/week: 3.0 standard drinks    Types: 2 Glasses of wine, 1 Shots of liquor per week    Comment: social drinker  . Drug use: No  . Sexual activity: Never  Lifestyle  . Physical activity:    Days per week: Not on file    Minutes per session: Not on file  . Stress: Not on file  Relationships  . Social connections:    Talks on phone: Not on file    Gets together: Not on file    Attends religious service: Not on file    Active member of club or organization: Not on file    Attends meetings of clubs or organizations: Not on file    Relationship status:  Not on file  Other Topics Concern  . Not on file  Social History Narrative  . Not on file   Family History  Problem Relation Age of Onset  . Alzheimer's disease Mother   . Heart attack Father   . Breast cancer Neg Hx    Allergies  Allergen Reactions  . Ibuprofen Other (See Comments)    Mouth sores  . Lipitor [Atorvastatin] Hives    Tolerates Crestor  . Metformin And Related Diarrhea  . Codeine Itching   Prior to Admission medications   Medication Sig Start Date End Date Taking? Authorizing Provider  acetaminophen (TYLENOL) 325 MG tablet Take 650 mg by mouth every 6 (six) hours as needed for mild pain or moderate pain.   Yes [provider]  amLODipine (NORVASC) 5 MG tablet Take 5 mg by mouth daily.   Yes [provider]  Calcium Carbonate-Vitamin D (CALCIUM 600+D PO) Take 1 tablet by mouth daily.   Yes [provider]  diclofenac sodium (VOLTAREN) 1 % GEL Apply 2 g topically 4 (four) times daily. Patient taking differently: Apply 2 g topically daily.  10/03/17  Yes Tarry Kos, MD  diphenhydramine-acetaminophen (  TYLENOL PM) 25-500 MG TABS tablet Take 2 tablets by mouth at bedtime.   Yes [provider]  glimepiride (AMARYL) 4 MG tablet Take 4 mg by mouth 2 (two) times daily.    Yes [provider]  lisinopril-hydrochlorothiazide (PRINZIDE,ZESTORETIC) 20-25 MG per tablet Take 1 tablet by mouth daily.   Yes [provider]  Methylcellulose, Laxative, (CITRUCEL PO) Take 1 tablet by mouth daily.   Yes [provider]  Multiple Vitamin (MULTIVITAMIN WITH MINERALS) TABS tablet Take 1 tablet by mouth at bedtime.   Yes [provider]  OVER THE COUNTER MEDICATION Take 1 tablet by mouth 2 (two) times daily. GBCQ   Yes [provider]  pantoprazole (PROTONIX) 40 MG tablet Take 40 mg by mouth See admin instructions. Take 40 mg by mouth daily for 14 days, do not take for 14 days then repeat.   Yes [provider]  polycarbophil (FIBERCON) 625 MG tablet Take 625 mg by mouth daily.   Yes [provider]  rosuvastatin (CRESTOR) 20 MG tablet Take 20 mg by mouth daily.   Yes [provider]  vitamin C (ASCORBIC ACID) 500 MG tablet Take 500 mg by mouth 2 (two) times daily.   Yes [provider]  Vitamin E 400 units TABS Take 400 Units by mouth daily.    Yes [provider]     Positive ROS: All other systems have been reviewed and were otherwise negative with the exception of those mentioned in the HPI and as above.  Physical Exam: General: Alert, no acute distress Cardiovascular: No pedal edema Respiratory: No cyanosis, no use of accessory musculature GI: abdomen soft Skin: No lesions in the area of chief complaint Neurologic: Sensation intact distally Psychiatric: Patient is competent for consent with normal mood and affect Lymphatic: no lymphedema  MUSCULOSKELETAL: exam stable  Assessment: arthrofibrosis left knee  Plan: Plan for Procedure(s): CLOSED MANIPULATION LEFT KNEE  The risks benefits and alternatives were discussed with the patient including but not limited to the risks of nonoperative treatment, versus surgical intervention including infection, bleeding, nerve injury,  blood clots, cardiopulmonary complications, morbidity, mortality, among others, and they were willing to proceed.   Glee Arvin, MD   07/17/2018 7:24 AM

## 2018-07-17 NOTE — Telephone Encounter (Signed)
Patient called stated that she was told to start PT tomorrow.  Please give patient a call [phone]

## 2018-07-17 NOTE — Transfer of Care (Signed)
Immediate Anesthesia Transfer of Care Note  Patient: Robyn [name]  Procedure(s) Performed: CLOSED MANIPULATION LEFT KNEE (Left Knee)  Patient Location: PACU  Anesthesia Type:General  Level of Consciousness: awake, alert , oriented and patient cooperative  Airway & Oxygen Therapy: Patient Spontanous Breathing and Patient connected to face mask oxygen  Post-op Assessment: Report given to RN and Post -op Vital signs reviewed and stable  Post vital signs: Reviewed and stable  Last Vitals:  Vitals Value Taken Time  BP    Temp    Pulse 78 07/17/2018  7:49 AM  Resp 13 07/17/2018  7:49 AM  SpO2 100 % 07/17/2018  7:49 AM  Vitals shown include unvalidated device data.  Last Pain:  Vitals:   07/17/18 0643  TempSrc: Oral  PainSc: 1       Patients Stated Pain Goal: 1 (07/17/18 6440)  Complications: No apparent anesthesia complications

## 2018-07-17 NOTE — Discharge Instructions (Signed)

## 2018-07-17 NOTE — Anesthesia Postprocedure Evaluation (Signed)
Anesthesia Post Note  Patient: Robyn [name]  Procedure(s) Performed: CLOSED MANIPULATION LEFT KNEE (Left Knee)     Patient location during evaluation: PACU Anesthesia Type: General Level of consciousness: awake and alert Pain management: pain level controlled Vital Signs Assessment: post-procedure vital signs reviewed and stable Respiratory status: spontaneous breathing, nonlabored ventilation and respiratory function stable Cardiovascular status: blood pressure returned to baseline and stable Postop Assessment: no apparent nausea or vomiting Anesthetic complications: no    Last Vitals:  Vitals:   07/17/18 0800 07/17/18 0830  BP: 112/62 (!) 158/68  Pulse: 73 69  Resp: 19 18  Temp:  (!) 36.4 C  SpO2: 98% 100%    Last Pain:  Vitals:   07/17/18 0830  TempSrc:   PainSc: 0-No pain                 Lowella Curb

## 2018-07-17 NOTE — Op Note (Signed)
   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  PREOPERATIVE DIAGNOSIS: Arthrofibrosis of left prosthetic total knee  POSTOPERATIVE DIAGNOSIS: Same.  PROCEDURE: 1. Manipulation of left knee joint under anesthesia 2. Injection of left knee joint with 10 cc of 1% lidocaine  SURGEON: N. Eduard Roux, M.D.  ASSIST: Madalyn Rob, [place]  ANESTHESIA:  MAC  IV FLUIDS AND URINE: See anesthesia.  ESTIMATED BLOOD LOSS: none  IMPLANTS: none  DRAINS: none  COMPLICATIONS: None.  DESCRIPTION OF PROCEDURE: The patient was brought to the operating room and placed supine on the operating table.  The patient had been signed prior to the procedure and this was documented. The patient had the anesthesia placed by the anesthesiologist.  A time-out was performed to confirm that this was the correct patient, site, side and location.     Manipulation of the left knee was then performed with audible facemask tearing of scar tissue and improvement of her flexion from 90 degrees to 120 degrees.  Then under sterile conditions I injected 10 cc of 1% lidocaine into the knee joint.  A Band-Aid was applied.  Patient tolerated procedure well had no immediate complications.  POSTOPERATIVE PLAN: This is a follow-up in the office in 2 weeks.  She will begin her CPM shoulder this afternoon and outpatient physical therapy tomorrow.  Robyn Cecil, MD [place] 7:45 AM

## 2018-07-17 NOTE — Telephone Encounter (Signed)
See message.

## 2018-07-17 NOTE — Telephone Encounter (Signed)
Yes that would be great! 

## 2018-07-18 ENCOUNTER — Ambulatory Visit: Payer: Medicare Other | Attending: Orthopaedic Surgery | Admitting: Physical Therapy

## 2018-07-18 ENCOUNTER — Encounter (HOSPITAL_BASED_OUTPATIENT_CLINIC_OR_DEPARTMENT_OTHER): Payer: Self-pay | Admitting: Orthopaedic Surgery

## 2018-07-18 DIAGNOSIS — G8929 Other chronic pain: Secondary | ICD-10-CM | POA: Diagnosis not present

## 2018-07-18 DIAGNOSIS — R2689 Other abnormalities of gait and mobility: Secondary | ICD-10-CM | POA: Diagnosis not present

## 2018-07-18 DIAGNOSIS — M25562 Pain in left knee: Secondary | ICD-10-CM | POA: Insufficient documentation

## 2018-07-18 DIAGNOSIS — M25662 Stiffness of left knee, not elsewhere classified: Secondary | ICD-10-CM

## 2018-07-18 NOTE — Therapy (Signed)
[facility] [address] [place], [place], [address] Phone: [phone]   Fax:  [phone]  Physical Therapy Evaluation  Patient Details  Name: Robyn [name] MRN: [id number] Date of Birth: 19-Dec-1945 Referring Provider (PT): Dr Gershon Mussel    Encounter Date: 07/18/2018  PT End of Session - 07/18/18 1322    Visit Number  1    Number of Visits  12    Date for PT Re-Evaluation  08/29/18    PT Start Time  0940    PT Stop Time  1016    PT Time Calculation (min)  36 min    Activity Tolerance  Patient tolerated treatment well    Behavior During Therapy  [facility] for tasks assessed/performed       Past Medical History:  Diagnosis Date  . Arthritis   . Back pain   . Diabetes mellitus without complication (HCC)   . GERD (gastroesophageal reflux disease)   . Heart murmur   . Hypertension     Past Surgical History:  Procedure Laterality Date  . ABDOMINAL HYSTERECTOMY    . BACK SURGERY     L4-L5  . CARPAL TUNNEL RELEASE Bilateral   . KNEE CLOSED REDUCTION Left 07/17/2018   Procedure: CLOSED MANIPULATION LEFT KNEE;  Surgeon: Tarry Kos, MD;  Location: [facility];  Service: Orthopedics;  Laterality: Left;  . TONSILLECTOMY    . TOTAL KNEE ARTHROPLASTY Left 04/29/2018   Procedure: LEFT TOTAL KNEE ARTHROPLASTY;  Surgeon: Tarry Kos, MD;  Location: [facility];  Service: Orthopedics;  Laterality: Left;    There were no vitals filed for this visit.   Subjective Assessment - 07/18/18 0944    Subjective  Patient is S/P manipulation on 07/18/2018. She is currenely in very little pain. She is walking without a walker. She has a CPM at home.     Limitations  Standing;Walking    How long can you sit comfortably?  Stiffens after a while     How long can you stand comfortably?  < 1 hour     How long can you walk comfortably?  limited community distances     Diagnostic tests  Nothing post-op     Patient Stated Goals  bend  better, walk better    Currently in Pain?  Yes    Pain Score  1     Pain Location  Knee    Pain Orientation  Left    Pain Descriptors / Indicators  Aching    Pain Type  Chronic pain    Pain Onset  More than a month ago    Pain Frequency  Constant    Aggravating Factors   getting up     Pain Relieving Factors  rest;     Effect of Pain on Daily Activities  diffuclty crossing her leg and bending         [facility] PT Assessment - 07/18/18 0001      Assessment   Medical Diagnosis  Left Knee manipulation    Referring Provider (PT)  Dr Gershon Mussel     Onset Date/Surgical Date  07/17/18    Hand Dominance  Right    Next MD Visit  April 9th 2020     [facility] Therapy  [facility]       Precautions   Precautions  None      Restrictions   Weight Bearing Restrictions  No  Balance Screen   Has the patient fallen in the past 6 months  No    Has the patient had a decrease in activity level because of a fear of falling?   No    Is the patient reluctant to leave their home because of a fear of falling?   No      Home Environment   Additional Comments  14 steps inside her house       Prior Function   Level of Independence  Independent      Cognition   Overall Cognitive Status  Within Functional Limits for tasks assessed    Attention  Focused    Focused Attention  Appears intact    Memory  Appears intact    Awareness  Appears intact    Problem Solving  Appears intact      Observation/Other Assessments   Skin Integrity  Well healed surgical wound     Focus on Therapeutic Outcomes (FOTO)   40% limitation 34% epected      Sensation   Light Touch  Appears Intact      Coordination   Gross Motor Movements are Fluid and Coordinated  Yes    Fine Motor Movements are Fluid and Coordinated  Yes      ROM / Strength   AROM / PROM / Strength  AROM;PROM;Strength      AROM   AROM Assessment Site  Knee    Right/Left Knee  Left    Left Knee Extension   -13    Left Knee Flexion  85      PROM   PROM Assessment Site  Knee    Right/Left Knee  Left    Left Knee Extension  -10    Left Knee Flexion  90      Strength   Strength Assessment Site  Knee;Hip    Right/Left Hip  Left    Left Hip Flexion  4+/5    Left Hip ABduction  5/5    Left Hip ADduction  5/5    Right/Left Knee  Right;Left    Right Knee Flexion  5/5    Right Knee Extension  5/5    Left Knee Flexion  5/5    Left Knee Extension  4+/5      Palpation   Palpation comment  no unexpected tenderness to palpation       Ambulation/Gait   Gait Comments  decreased left single leg stance; decreased left hip flexion                 Objective measurements completed on examination: See above findings.      [facility] Adult PT Treatment/Exercise - 07/18/18 0001      Exercises   Exercises  Knee/Hip      Knee/Hip Exercises: Stretches   Other Knee/Hip Stretches  knee extension stretch advised to do on zero foam; knee flexion stretch    Other Knee/Hip Stretches  patellar mobilization with education on self mobilization       Manual Therapy   Manual Therapy  Passive ROM;Joint mobilization    Joint Mobilization  4 patellar mobilization;              PT Education - 07/18/18 1322    Education Details  Rice, symptom mangement     Person(s) Educated  Patient    Methods  Explanation;Demonstration;Tactile cues;Verbal cues    Comprehension  Verbalized understanding;Returned demonstration;Verbal cues required;Tactile cues required;Need further instruction  PT Short Term Goals - 07/18/18 0952      PT SHORT TERM GOAL #1   Title  Patient will increase active left kneeflexion to 110     Time  3    Period  Weeks    Status  New    Target Date  08/08/18      PT SHORT TERM GOAL #2   Title  Patient will demonstrate         PT Long Term Goals - 07/18/18 1059      PT LONG TERM GOAL #1   Title  Patient will go up and down 14 steps without pain with a reciprocal  pattern     Time  6    Period  Weeks    Status  New    Target Date  09/12/18      PT LONG TERM GOAL #2   Title  Patient will demonstrate 110 degrees of knee flexion in order to get in and out of chairs without difficulty     Time  6    Period  Weeks    Status  New    Target Date  08/29/18      PT LONG TERM GOAL #3   Title  Patient will demosntrate a 34% limitation on FOTO     Time  6    Period  Weeks    Status  New    Target Date  08/29/18             Plan - 07/18/18 1311    Clinical Impression Statement  Patient is a 73 year old female S/P left knee manipulation on 07/18/2018. Baseline total arc of movement measured at 10-90 today. She does gaurd. She was strongly encouraged 304 times a day to agresivly stretch her knee. She was educated on using RICE to de3crease inflammation if needed. He strength is good. She would benefit from skilled therapy to improve knee ROM and overall function.     Personal Factors and Comorbidities  Comorbidity 2    Comorbidities  Low back pain, knee manipulation     Examination-Activity Limitations  Bend;Squat;Carry;Sleep;Bed Mobility    Examination-Participation Restrictions  Church;Shop;Laundry;Meal Prep    Stability/Clinical Decision Making  Stable/Uncomplicated    Clinical Decision Making  Low    Rehab Potential  Good    PT Frequency  2x / week    PT Duration  6 weeks    PT Treatment/Interventions  ADLs/Self Care Home Management;Cryotherapy;Electrical Stimulation;Moist Heat;Traction;Ultrasound;DME Instruction;Gait training;Stair training;Functional mobility training;Therapeutic exercise;Therapeutic activities;Neuromuscular re-education;Patient/family education;Manual techniques;Passive range of motion;Taping    PT Next Visit Plan  continue agressive PROM within tolerance. continue with functional strengthening;  stairs and squats; consdier mullhgan strap mobilization; continue with patellar mobilization; with her potenially seeing 4-5 therapist  over the next week lets try to keep things simple and consistnet for her.     PT Home Exercise Plan  stap flexion stretch; knee extension stretch; patellar mobilization     Consulted and Agree with Plan of Care  Patient       Patient will benefit from skilled therapeutic intervention in order to improve the following deficits and impairments:  Abnormal gait, Pain, Difficulty walking, Decreased safety awareness, Decreased range of motion, Decreased endurance, Decreased activity tolerance, Decreased strength, Increased edema, Decreased mobility  Visit Diagnosis: Stiffness of left knee, not elsewhere classified  Chronic pain of left knee  Other abnormalities of gait and mobility     Problem List Patient Active Problem List  Diagnosis Date Noted  . S/P TKR (total knee replacement), left 04/29/2018  . Primary osteoarthritis of left knee 10/03/2017  . Bilateral primary osteoarthritis of knee 04/30/2017  . Pleuritic chest pain 06/29/2012  . Diabetes mellitus type 2 without retinopathy (HCC) 06/29/2012  . Hypertension 06/29/2012    Dessie Coma PT DPT  07/18/2018, 1:28 PM  [facility] [address] [place], [place], [address] Phone: [phone]   Fax:  [phone]  Name: Robyn [name] MRN: [id number] Date of Birth: 12-08-1945

## 2018-07-18 NOTE — Addendum Note (Signed)
Addended by: Dessie Coma on: 07/18/2018 02:04 PM   Modules accepted: Orders

## 2018-07-19 ENCOUNTER — Ambulatory Visit: Payer: Medicare Other | Admitting: Physical Therapy

## 2018-07-19 ENCOUNTER — Other Ambulatory Visit: Payer: Self-pay

## 2018-07-19 DIAGNOSIS — G8929 Other chronic pain: Secondary | ICD-10-CM

## 2018-07-19 DIAGNOSIS — M25562 Pain in left knee: Secondary | ICD-10-CM | POA: Diagnosis not present

## 2018-07-19 DIAGNOSIS — R2689 Other abnormalities of gait and mobility: Secondary | ICD-10-CM | POA: Diagnosis not present

## 2018-07-19 DIAGNOSIS — M25662 Stiffness of left knee, not elsewhere classified: Secondary | ICD-10-CM

## 2018-07-19 NOTE — Therapy (Addendum)
[place], [place], [address] Phone: [phone]   Fax:  [phone]  Physical Therapy Treatment/Discharge  Patient Details  Name: Robyn [name] MRN: [id number] Date of Birth: 1945-06-25 Referring Provider (PT): Dr Frankey Shown    Encounter Date: 07/19/2018  PT End of Session - 07/19/18 0941    Visit Number  2    Number of Visits  12    Date for PT Re-Evaluation  08/29/18    PT Start Time  0850    PT Stop Time  0933    PT Time Calculation (min)  43 min    Activity Tolerance  Patient tolerated treatment well    Behavior During Therapy  [facility] for tasks assessed/performed       Past Medical History:  Diagnosis Date  . Arthritis   . Back pain   . Diabetes mellitus without complication ([place])   . GERD (gastroesophageal reflux disease)   . Heart murmur   . Hypertension     Past Surgical History:  Procedure Laterality Date  . ABDOMINAL HYSTERECTOMY    . BACK SURGERY     L4-L5  . CARPAL TUNNEL RELEASE Bilateral   . KNEE CLOSED REDUCTION Left 07/17/2018   Procedure: CLOSED MANIPULATION LEFT KNEE;  Surgeon: Leandrew Koyanagi, MD;  Location: [place];  Service: Orthopedics;  Laterality: Left;  . TONSILLECTOMY    . TOTAL KNEE ARTHROPLASTY Left 04/29/2018   Procedure: LEFT TOTAL KNEE ARTHROPLASTY;  Surgeon: Leandrew Koyanagi, MD;  Location: [place];  Service: Orthopedics;  Laterality: Left;    There were no vitals filed for this visit.  Subjective Assessment - 07/19/18 0908    Subjective  Pt relays knee feels good today, not a lot of pain just stiff    Currently in Pain?  Yes    Pain Score  1     Pain Location  Knee    Pain Orientation  Left    Pain Descriptors / Indicators  Tightness    Pain Type  Chronic pain         [facility] PT Assessment - 07/19/18 0001      Assessment   Medical Diagnosis  Left Knee manipulation    Referring Provider (PT)  Dr Frankey Shown     Onset Date/Surgical Date   07/17/18    Hand Dominance  Right    Next MD Visit  April 9th 2020     Prior Therapy  [facility]       AROM   Left Knee Flexion  90      PROM   Left Knee Flexion  99                   [facility] Adult PT Treatment/Exercise - 07/19/18 0001      Knee/Hip Exercises: Stretches   Passive Hamstring Stretch  Left;2 reps;30 seconds    Quad Stretch  Left;30 seconds;3 reps    Sports administrator Limitations  prone with strap    Other Knee/Hip Stretches  standing knee flexion lunge stretch at 4 way hip machine 10 sec X 15, heelslides AAROM 5 sec X 15 supine    Other Knee/Hip Stretches  deep knee bend strech with squat with UE support at counter 10 sec X 15      Knee/Hip Exercises: Standing   Forward Step Up  Left;15 reps;Step Height: 6"      Manual Therapy   Manual  therapy comments  knee PROM    Joint Mobilization  patella mobs, knee mobs for flex/ext             PT Education - 07/19/18 0941    Education Details  to add in deep knee bend and lunge stretch for knee ROM    Person(s) Educated  Patient    Methods  Explanation;Demonstration    Comprehension  Verbalized understanding;Returned demonstration       PT Short Term Goals - 07/18/18 0952      PT SHORT TERM GOAL #1   Title  Patient will increase active left kneeflexion to 110     Time  3    Period  Weeks    Status  New    Target Date  08/08/18      PT SHORT TERM GOAL #2   Title  Patient will demonstrate         PT Long Term Goals - 07/18/18 1059      PT LONG TERM GOAL #1   Title  Patient will go up and down 14 steps without pain with a reciprocal pattern     Time  6    Period  Weeks    Status  New    Target Date  09/12/18      PT LONG TERM GOAL #2   Title  Patient will demonstrate 110 degrees of knee flexion in order to get in and out of chairs without difficulty     Time  6    Period  Weeks    Status  New    Target Date  08/29/18      PT LONG TERM GOAL #3    Title  Patient will demosntrate a 34% limitation on FOTO     Time  6    Period  Weeks    Status  New    Target Date  08/29/18            Plan - 07/19/18 0942    Clinical Impression Statement  Session focused on knee ROM paticularly flexion today and body weight stretching added today with good tolerance. After extensive stretching and MT she was able to show good improvment in knee ROM today. PT will continue to progress this as able.     Rehab Potential  Good    PT Frequency  2x / week    PT Duration  6 weeks    PT Treatment/Interventions  ADLs/Self Care Home Management;Cryotherapy;Electrical Stimulation;Moist Heat;Traction;Ultrasound;DME Instruction;Gait training;Stair training;Functional mobility training;Therapeutic exercise;Therapeutic activities;Neuromuscular re-education;Patient/family education;Manual techniques;Passive range of motion;Taping    PT Next Visit Plan  continue agressive PROM within tolerance. continue with functional strengthening;  stairs and squats; consdier mullhgan strap mobilization; continue with patellar mobilization; with her potenially seeing 4-5 therapist over the next week lets try to keep things simple and consistnet for her.     PT Home Exercise Plan  stap flexion stretch; knee extension stretch; patellar mobilization, added deep squat and lunge stretch for knee flexion    Consulted and Agree with Plan of Care  Patient       Patient will benefit from skilled therapeutic intervention in order to improve the following deficits and impairments:  Abnormal gait, Pain, Difficulty walking, Decreased safety awareness, Decreased range of motion, Decreased endurance, Decreased activity tolerance, Decreased strength, Increased edema, Decreased mobility  Visit Diagnosis: Stiffness of left knee, not elsewhere classified  Chronic pain of left knee  Other abnormalities of gait and mobility  Problem List Patient Active Problem List   Diagnosis Date Noted   . S/P TKR (total knee replacement), left 04/29/2018  . Primary osteoarthritis of left knee 10/03/2017  . Bilateral primary osteoarthritis of knee 04/30/2017  . Pleuritic chest pain 06/29/2012  . Diabetes mellitus type 2 without retinopathy ([place]) 06/29/2012  . Hypertension 06/29/2012    Rhetta Mura, DPT 07/19/2018, 9:46 AM  [facility] [address] [place], [place], [address] Phone: [phone]   Fax:  [phone]  Name: Robyn [name] MRN: [id number] Date of Birth: 13-Jan-1946  PHYSICAL THERAPY DISCHARGE SUMMARY  Visits from Start of Care: 2  Current functional level related to goals / functional outcomes: See above   Remaining deficits: See above   Education / Equipment: Anatomy of condition, POC, HEP, exercise form/rationale  Plan: Patient agrees to discharge.  Patient goals were not met. Patient is being discharged due to                                                     ?????   Pt cancelled PT appointments due to COVID-19 and was contacted today- reports she would like to be d/c from PT at this time.    Jessica C. Hightower PT, DPT 08/27/18 2:43 PM

## 2018-07-22 ENCOUNTER — Ambulatory Visit: Payer: Medicare Other

## 2018-07-23 ENCOUNTER — Ambulatory Visit: Payer: Medicare Other

## 2018-07-24 ENCOUNTER — Telehealth: Payer: Self-pay | Admitting: Physical Therapy

## 2018-07-24 ENCOUNTER — Ambulatory Visit: Payer: Medicare Other

## 2018-07-24 NOTE — Telephone Encounter (Signed)
Contacted [facility] regarding patient cancellation of visits 2nd to Covid -19. Left message with staff for Dr Roda Shutters. Call back number left.

## 2018-07-24 NOTE — Telephone Encounter (Signed)
Theodoro Grist, PT with [facility] wanted Dr. Roda Shutters to be aware patient cancelled all PT appointments this week due to concerns over COVID-19.  Patient will not be going to outpatient physical therapy until she is more comfortable with coming out.  CB for Theodoro Grist  [phone]

## 2018-07-24 NOTE — Telephone Encounter (Signed)
See message.

## 2018-07-25 ENCOUNTER — Ambulatory Visit: Payer: Medicare Other

## 2018-07-25 NOTE — Telephone Encounter (Signed)
Called patient to advise on Xu's message. She is aware.

## 2018-07-25 NOTE — Telephone Encounter (Signed)
Called Robyn [name] no answer LMOM to return call.

## 2018-07-25 NOTE — Telephone Encounter (Signed)
Ok I understand.  Keep doing the CPM in the mean time then.  How much flexion can she get to?

## 2018-07-26 ENCOUNTER — Ambulatory Visit: Payer: Medicare Other

## 2018-07-29 NOTE — Telephone Encounter (Signed)
On her last visit with [place] she got to 99 degrees of flexion. I will forward her last note. Thanks.

## 2018-07-31 ENCOUNTER — Telehealth (INDEPENDENT_AMBULATORY_CARE_PROVIDER_SITE_OTHER): Payer: Self-pay

## 2018-07-31 NOTE — Telephone Encounter (Signed)
Patient called back and answered "no" to all the prescreening questions.  Thank you. °

## 2018-07-31 NOTE — Telephone Encounter (Signed)
Called patient. No answer. LMOM to return call. If she calls back please ask the screening questions. Thank you.  Do you have now or have you had in the past 7 days a fever and/or chills?   Do you have now or have you had in the past 7 days a cough?   Do you have now or have you had in the last 7 days nausea, vomiting or abdominal pain?   Have you been exposed to anyone who has tested positive for COVID-19?   Have you or anyone who lives with you traveled within the last month? 

## 2018-08-01 ENCOUNTER — Other Ambulatory Visit: Payer: Self-pay

## 2018-08-01 ENCOUNTER — Encounter (INDEPENDENT_AMBULATORY_CARE_PROVIDER_SITE_OTHER): Payer: Self-pay | Admitting: Orthopaedic Surgery

## 2018-08-01 ENCOUNTER — Ambulatory Visit (INDEPENDENT_AMBULATORY_CARE_PROVIDER_SITE_OTHER): Payer: Medicare Other | Admitting: Physician Assistant

## 2018-08-01 DIAGNOSIS — Z96652 Presence of left artificial knee joint: Secondary | ICD-10-CM

## 2018-08-01 NOTE — Progress Notes (Signed)
   Post-Op Visit Note   Patient: Robyn [name]           Date of Birth: [birth year]           MRN: [id number] Visit Date: 08/01/2018 PCP: Marden Noble, MD   Assessment & Plan:  Chief Complaint:  Chief Complaint  Patient presents with  . Left Knee - Pain, Follow-up, Routine Post Op   Visit Diagnoses:  1. S/P TKR (total knee replacement), left     Plan: Patient is a pleasant 73 year old female who presents our clinic today 15 days status post left knee manipulation, date of surgery 07/17/2018 and 94 days status post left total knee replacement, date of surgery 04/29/2018.  She went to to formal physical therapy sessions following her manipulation just prior to the state home order.  She has been working on a home exercise program and using a CPM machine.  She has some soreness, but no significant pain.  Examination of her left knee reveals a fully healed surgical scar.  Range of motion 0 to 100 degrees.  She is stable to valgus and varus stress.  She is neurovascularly intact distally.  At this point, she will continue to really push things at home as she is unable to attend formal physical therapy.  We will see her at her already scheduled appointment on June 17.  Call with concerns or questions in the meantime.  Follow-Up Instructions: Return in about 2 months (around 10/09/2018) for already scheduled appt.   Orders:  No orders of the defined types were placed in this encounter.  No orders of the defined types were placed in this encounter.   Imaging: No new imaging  PMFS History: Patient Active Problem List   Diagnosis Date Noted  . S/P TKR (total knee replacement), left 04/29/2018  . Primary osteoarthritis of left knee 10/03/2017  . Bilateral primary osteoarthritis of knee 04/30/2017  . Pleuritic chest pain 06/29/2012  . Diabetes mellitus type 2 without retinopathy ([facility]) 06/29/2012  . Hypertension 06/29/2012   Past Medical History:  Diagnosis Date  . Arthritis   . Back  pain   . Diabetes mellitus without complication ([facility])   . GERD (gastroesophageal reflux disease)   . Heart murmur   . Hypertension     Family History  Problem Relation Age of Onset  . Alzheimer's disease Mother   . Heart attack Father   . Breast cancer Neg Hx     Past Surgical History:  Procedure Laterality Date  . ABDOMINAL HYSTERECTOMY    . BACK SURGERY     L4-L5  . CARPAL TUNNEL RELEASE Bilateral   . KNEE CLOSED REDUCTION Left 07/17/2018   Procedure: CLOSED MANIPULATION LEFT KNEE;  Surgeon: Tarry Kos, MD;  Location: [facility];  Service: Orthopedics;  Laterality: Left;  . TONSILLECTOMY    . TOTAL KNEE ARTHROPLASTY Left 04/29/2018   Procedure: LEFT TOTAL KNEE ARTHROPLASTY;  Surgeon: Tarry Kos, MD;  [facility];  Service: Orthopedics;  Laterality: Left;   Social History   Occupational History  . Not on file  Tobacco Use  . Smoking status: Never Smoker  . Smokeless tobacco: Never Used  Substance and Sexual Activity  . Alcohol use: Yes    Alcohol/week: 3.0 standard drinks    Types: 2 Glasses of wine, 1 Shots of liquor per week    Comment: social drinker  . Drug use: No  . Sexual activity: Never

## 2018-10-02 DIAGNOSIS — N3946 Mixed incontinence: Secondary | ICD-10-CM | POA: Diagnosis not present

## 2018-10-09 ENCOUNTER — Other Ambulatory Visit: Payer: Self-pay

## 2018-10-09 ENCOUNTER — Ambulatory Visit (INDEPENDENT_AMBULATORY_CARE_PROVIDER_SITE_OTHER): Payer: Medicare Other | Admitting: Orthopaedic Surgery

## 2018-10-09 ENCOUNTER — Encounter: Payer: Self-pay | Admitting: Orthopaedic Surgery

## 2018-10-09 DIAGNOSIS — Z96652 Presence of left artificial knee joint: Secondary | ICD-10-CM

## 2018-10-09 NOTE — Progress Notes (Signed)
   Post-Op Visit Note   Patient: Robyn [name]           Date of Birth: Jul 18, 1945           MRN: [id number] Visit Date: 10/09/2018 PCP: Josetta Huddle, MD   Assessment & Plan:  Chief Complaint:  Chief Complaint  Patient presents with  . Left Knee - Follow-up   Visit Diagnoses:  1. S/P TKR (total knee replacement), left     Plan: Patient is a pleasant 73 year old female who presents our clinic today for follow-up of her left total knee replacement.  Primary total knee replacement 04/29/2018 followed by left knee manipulation under anesthesia 07/17/2018.  She has been doing well.  She is finished formal physical therapy.  She has minimal pain.  Examination left knee reveals a fully healed surgical scar.  Range of motion 0 to 105 degrees.  Stable to valgus varus stress.  She is neurovascular intact distally.  At this point, she will continue with range of motion strengthening exercises.  She will follow-up with [place] in 7 months time for repeat evaluation and x-rays.  Dental prophylaxis reinforced today.  Call with concerns or questions in the meantime.  Follow-Up Instructions: Return in about 7 months (around 05/11/2019).   Orders:  No orders of the defined types were placed in this encounter.  No orders of the defined types were placed in this encounter.   Imaging: No new imaging  PMFS History: Patient Active Problem List   Diagnosis Date Noted  . S/P TKR (total knee replacement), left 04/29/2018  . Primary osteoarthritis of left knee 10/03/2017  . Bilateral primary osteoarthritis of knee 04/30/2017  . Pleuritic chest pain 06/29/2012  . Diabetes mellitus type 2 without retinopathy ([place]) 06/29/2012  . Hypertension 06/29/2012   Past Medical History:  Diagnosis Date  . Arthritis   . Back pain   . Diabetes mellitus without complication ([place])   . GERD (gastroesophageal reflux disease)   . Heart murmur   . Hypertension     Family History  Problem Relation Age of Onset  .  Alzheimer's disease Mother   . Heart attack Father   . Breast cancer Neg Hx     Past Surgical History:  Procedure Laterality Date  . ABDOMINAL HYSTERECTOMY    . BACK SURGERY     L4-L5  . CARPAL TUNNEL RELEASE Bilateral   . KNEE CLOSED REDUCTION Left 07/17/2018   Procedure: CLOSED MANIPULATION LEFT KNEE;  Surgeon: Leandrew Koyanagi, MD;  Location: [facility];  Service: Orthopedics;  Laterality: Left;  . TONSILLECTOMY    . TOTAL KNEE ARTHROPLASTY Left 04/29/2018   Procedure: LEFT TOTAL KNEE ARTHROPLASTY;  Surgeon: Leandrew Koyanagi, MD;  Location: [facility];  Service: Orthopedics;  Laterality: Left;   Social History   Occupational History  . Not on file  Tobacco Use  . Smoking status: Never Smoker  . Smokeless tobacco: Never Used  Substance and Sexual Activity  . Alcohol use: Yes    Alcohol/week: 3.0 standard drinks    Types: 2 Glasses of wine, 1 Shots of liquor per week    Comment: social drinker  . Drug use: No  . Sexual activity: Never

## 2018-11-19 DIAGNOSIS — J029 Acute pharyngitis, unspecified: Secondary | ICD-10-CM | POA: Diagnosis not present

## 2018-12-23 ENCOUNTER — Other Ambulatory Visit: Payer: Self-pay | Admitting: *Deleted

## 2018-12-23 DIAGNOSIS — Z20822 Contact with and (suspected) exposure to covid-19: Secondary | ICD-10-CM

## 2018-12-23 DIAGNOSIS — R6889 Other general symptoms and signs: Secondary | ICD-10-CM | POA: Diagnosis not present

## 2018-12-24 LAB — NOVEL CORONAVIRUS, NAA: SARS-CoV-2, NAA: NOT DETECTED

## 2019-01-29 ENCOUNTER — Other Ambulatory Visit: Payer: Self-pay | Admitting: Internal Medicine

## 2019-01-29 ENCOUNTER — Other Ambulatory Visit: Payer: Self-pay

## 2019-01-29 DIAGNOSIS — I10 Essential (primary) hypertension: Secondary | ICD-10-CM | POA: Diagnosis not present

## 2019-01-29 DIAGNOSIS — Z1389 Encounter for screening for other disorder: Secondary | ICD-10-CM | POA: Diagnosis not present

## 2019-01-29 DIAGNOSIS — Z0001 Encounter for general adult medical examination with abnormal findings: Secondary | ICD-10-CM | POA: Diagnosis not present

## 2019-01-29 DIAGNOSIS — Z20828 Contact with and (suspected) exposure to other viral communicable diseases: Secondary | ICD-10-CM | POA: Diagnosis not present

## 2019-01-29 DIAGNOSIS — E1140 Type 2 diabetes mellitus with diabetic neuropathy, unspecified: Secondary | ICD-10-CM | POA: Diagnosis not present

## 2019-01-29 DIAGNOSIS — Z20822 Contact with and (suspected) exposure to covid-19: Secondary | ICD-10-CM

## 2019-01-29 DIAGNOSIS — Z1231 Encounter for screening mammogram for malignant neoplasm of breast: Secondary | ICD-10-CM

## 2019-01-31 LAB — NOVEL CORONAVIRUS, NAA: SARS-CoV-2, NAA: NOT DETECTED

## 2019-02-03 ENCOUNTER — Other Ambulatory Visit: Payer: Self-pay | Admitting: Internal Medicine

## 2019-02-03 DIAGNOSIS — E2839 Other primary ovarian failure: Secondary | ICD-10-CM

## 2019-03-14 ENCOUNTER — Other Ambulatory Visit: Payer: Self-pay

## 2019-03-14 ENCOUNTER — Ambulatory Visit
Admission: RE | Admit: 2019-03-14 | Discharge: 2019-03-14 | Disposition: A | Discharge status: Home / Self Care | Payer: Medicare Other | Source: Ambulatory Visit | Attending: Internal Medicine | Admitting: Internal Medicine

## 2019-03-14 DIAGNOSIS — Z1231 Encounter for screening mammogram for malignant neoplasm of breast: Secondary | ICD-10-CM | POA: Diagnosis not present

## 2019-03-28 DIAGNOSIS — R05 Cough: Secondary | ICD-10-CM | POA: Diagnosis not present

## 2019-03-29 DIAGNOSIS — R05 Cough: Secondary | ICD-10-CM | POA: Diagnosis not present

## 2019-04-08 ENCOUNTER — Other Ambulatory Visit: Payer: Medicare Other

## 2019-04-16 DIAGNOSIS — E119 Type 2 diabetes mellitus without complications: Secondary | ICD-10-CM | POA: Diagnosis not present

## 2019-04-16 DIAGNOSIS — J209 Acute bronchitis, unspecified: Secondary | ICD-10-CM | POA: Diagnosis not present

## 2019-05-13 ENCOUNTER — Ambulatory Visit: Payer: Medicare Other | Admitting: Orthopaedic Surgery

## 2019-05-13 ENCOUNTER — Other Ambulatory Visit: Payer: Self-pay

## 2019-05-13 ENCOUNTER — Encounter: Payer: Self-pay | Admitting: Orthopaedic Surgery

## 2019-05-13 ENCOUNTER — Ambulatory Visit: Payer: Self-pay

## 2019-05-13 DIAGNOSIS — Z96652 Presence of left artificial knee joint: Secondary | ICD-10-CM

## 2019-05-13 DIAGNOSIS — M722 Plantar fascial fibromatosis: Secondary | ICD-10-CM

## 2019-05-13 DIAGNOSIS — M79672 Pain in left foot: Secondary | ICD-10-CM | POA: Diagnosis not present

## 2019-05-13 NOTE — Progress Notes (Signed)
Office Visit Note   Patient: Robyn [name]           Date of Birth: May 08, 1945           MRN: [id number] Visit Date: 05/13/2019              Requested by: Robyn Huddle, MD [address] [place],  [place] [address] PCP: Robyn Huddle, MD   Assessment & Plan: Visit Diagnoses:  1. S/P TKR (total knee replacement), left   2. Plantar fasciitis of left foot     Plan: Impression is 1 year status post left total knee replacement and left foot plantar fasciitis.  In terms of the knee replacement she is very happy with her outcome and she has resumed all activities.  Dental prophylaxis was reinforced today.  In terms of the plantar fasciitis she will take over-the-counter NSAIDs and use exercises that I demonstrated to her today.  Overall the pain is not bad enough to where she is interested in injection today.  We will see her back in a year for follow-up of her left knee replacement with two-view x-rays of the left knee.  Follow-Up Instructions: Return in about 1 year (around 05/12/2020).   Orders:  Orders Placed This Encounter  Procedures  . XR KNEE 3 VIEW LEFT  . XR Os Calcis Left   No orders of the defined types were placed in this encounter.     Procedures: No procedures performed   Clinical Data: No additional findings.   Subjective: Chief Complaint  Patient presents with  . Left Knee - Pain    Robyn [name] is 1 year status post left total knee replacement.  And 9 months status post manipulation.  She is doing well overall and has resumed normal activity.  She has no complaints with her left knee.  She is also here to be seen for chronic left heel pain.  She feels like it has gotten somewhat better.  It hurts centrally underneath the heel.  Denies any injuries.   Review of Systems  Constitutional: Negative.   HENT: Negative.   Eyes: Negative.   Respiratory: Negative.   Cardiovascular: Negative.   Endocrine: Negative.   Musculoskeletal: Negative.     Neurological: Negative.   Hematological: Negative.   Psychiatric/Behavioral: Negative.   All other systems reviewed and are negative.    Objective: Vital Signs: There were no vitals taken for this visit.  Physical Exam Vitals and nursing note reviewed.  Constitutional:      Appearance: She is well-developed.  Pulmonary:     Effort: Pulmonary effort is normal.  Skin:    General: Skin is warm.     Capillary Refill: Capillary refill takes less than 2 seconds.  Neurological:     Mental Status: She is alert and oriented to person, place, and time.  Psychiatric:        Behavior: Behavior normal.        Thought Content: Thought content normal.        Judgment: Judgment normal.     Ortho Exam Left knee exam shows well-healed surgical scar.  Stable to varus valgus.  Excellent range of motion.  Left foot exam shows tenderness underneath the heel..  No signs of infection.  Posterior is nontender.   Specialty Comments:  No specialty comments available.  Imaging: XR Os Calcis Left  Result Date: 05/13/2019 Small plantar calcaneal spur.  XR KNEE 3 VIEW LEFT  Result Date: 05/13/2019 Stable total knee replacement  without complication    PMFS History: Patient Active Problem List   Diagnosis Date Noted  . Plantar fasciitis of left foot 05/13/2019  . S/P TKR (total knee replacement), left 04/29/2018  . Primary osteoarthritis of left knee 10/03/2017  . Bilateral primary osteoarthritis of knee 04/30/2017  . Pleuritic chest pain 06/29/2012  . Diabetes mellitus type 2 without retinopathy ([facility]) 06/29/2012  . Hypertension 06/29/2012   Past Medical History:  Diagnosis Date  . Arthritis   . Back pain   . Diabetes mellitus without complication ([facility])   . GERD (gastroesophageal reflux disease)   . Heart murmur   . Hypertension     Family History  Problem Relation Age of Onset  . Alzheimer's disease Mother   . Heart attack Father   . Breast cancer Neg Hx     Past Surgical  History:  Procedure Laterality Date  . ABDOMINAL HYSTERECTOMY    . BACK SURGERY     L4-L5  . CARPAL TUNNEL RELEASE Bilateral   . KNEE CLOSED REDUCTION Left 07/17/2018   Procedure: CLOSED MANIPULATION LEFT KNEE;  Surgeon: Robyn Kos, MD;  Location: [facility];  Service: Orthopedics;  Laterality: Left;  . TONSILLECTOMY    . TOTAL KNEE ARTHROPLASTY Left 04/29/2018   Procedure: LEFT TOTAL KNEE ARTHROPLASTY;  Surgeon: Robyn Kos, MD;  [facility];  Service: Orthopedics;  Laterality: Left;   Social History   Occupational History  . Not on file  Tobacco Use  . Smoking status: Never Smoker  . Smokeless tobacco: Never Used  Substance and Sexual Activity  . Alcohol use: Yes    Alcohol/week: 3.0 standard drinks    Types: 2 Glasses of wine, 1 Shots of liquor per week    Comment: social drinker  . Drug use: No  . Sexual activity: Never

## 2019-05-16 ENCOUNTER — Ambulatory Visit: Payer: Medicare Other | Attending: Internal Medicine

## 2019-05-16 DIAGNOSIS — Z23 Encounter for immunization: Secondary | ICD-10-CM

## 2019-05-16 NOTE — Progress Notes (Signed)
   Covid-19 Vaccination Clinic  Name:  Robyn [name]    MRN: [id number] DOB: 1945-06-12  05/16/2019  [name] was observed post Covid-19 immunization for 15 minutes without incidence. She was provided with Vaccine Information Sheet and instruction to access the V-Safe system.   [name] was instructed to call 911 with any severe reactions post vaccine: Marland Kitchen Difficulty breathing  . Swelling of your face and throat  . A fast heartbeat  . A bad rash all over your body  . Dizziness and weakness    Immunizations Administered    Name Date Dose VIS Date Route   Pfizer COVID-19 Vaccine 05/16/2019  9:48 AM 0.3 mL 04/04/2019 Intramuscular   Manufacturer: [employer], [employer]   Lot: [id number]   NDC: [address]-7

## 2019-06-05 ENCOUNTER — Ambulatory Visit: Payer: Medicare Other | Attending: Internal Medicine

## 2019-06-05 DIAGNOSIS — Z23 Encounter for immunization: Secondary | ICD-10-CM

## 2019-06-05 NOTE — Progress Notes (Signed)
   Covid-19 Vaccination Clinic  Name:  BECKIE VISCARDI    MRN: [id number] DOB: 1946-03-27  06/05/2019  Ms. Gallaher was observed post Covid-19 immunization for 15 minutes without incidence. She was provided with Vaccine Information Sheet and instruction to access the V-Safe system.   Ms. Naraine was instructed to call 911 with any severe reactions post vaccine: Marland Kitchen Difficulty breathing  . Swelling of your face and throat  . A fast heartbeat  . A bad rash all over your body  . Dizziness and weakness    Immunizations Administered    Name Date Dose VIS Date Route   Pfizer COVID-19 Vaccine 06/05/2019  3:30 PM 0.3 mL 04/04/2019 Intramuscular   Manufacturer: [employer], [employer]   Lot: [id number]   NDC: [address]-3

## 2019-07-31 ENCOUNTER — Other Ambulatory Visit: Payer: Self-pay | Admitting: Internal Medicine

## 2019-07-31 DIAGNOSIS — I10 Essential (primary) hypertension: Secondary | ICD-10-CM | POA: Diagnosis not present

## 2019-07-31 DIAGNOSIS — E2839 Other primary ovarian failure: Secondary | ICD-10-CM

## 2019-07-31 DIAGNOSIS — M170 Bilateral primary osteoarthritis of knee: Secondary | ICD-10-CM | POA: Diagnosis not present

## 2019-07-31 DIAGNOSIS — M179 Osteoarthritis of knee, unspecified: Secondary | ICD-10-CM | POA: Diagnosis not present

## 2019-07-31 DIAGNOSIS — E1140 Type 2 diabetes mellitus with diabetic neuropathy, unspecified: Secondary | ICD-10-CM | POA: Diagnosis not present

## 2019-08-06 IMAGING — CR DG KNEE COMPLETE 4+V*R*
4 series · 4 of 4 positions shown · non-contrast
Comparison: None.

CLINICAL DATA: Right knee pain after twisting injury

EXAM:
RIGHT KNEE - COMPLETE 4+ VIEW

[x knee ap right]
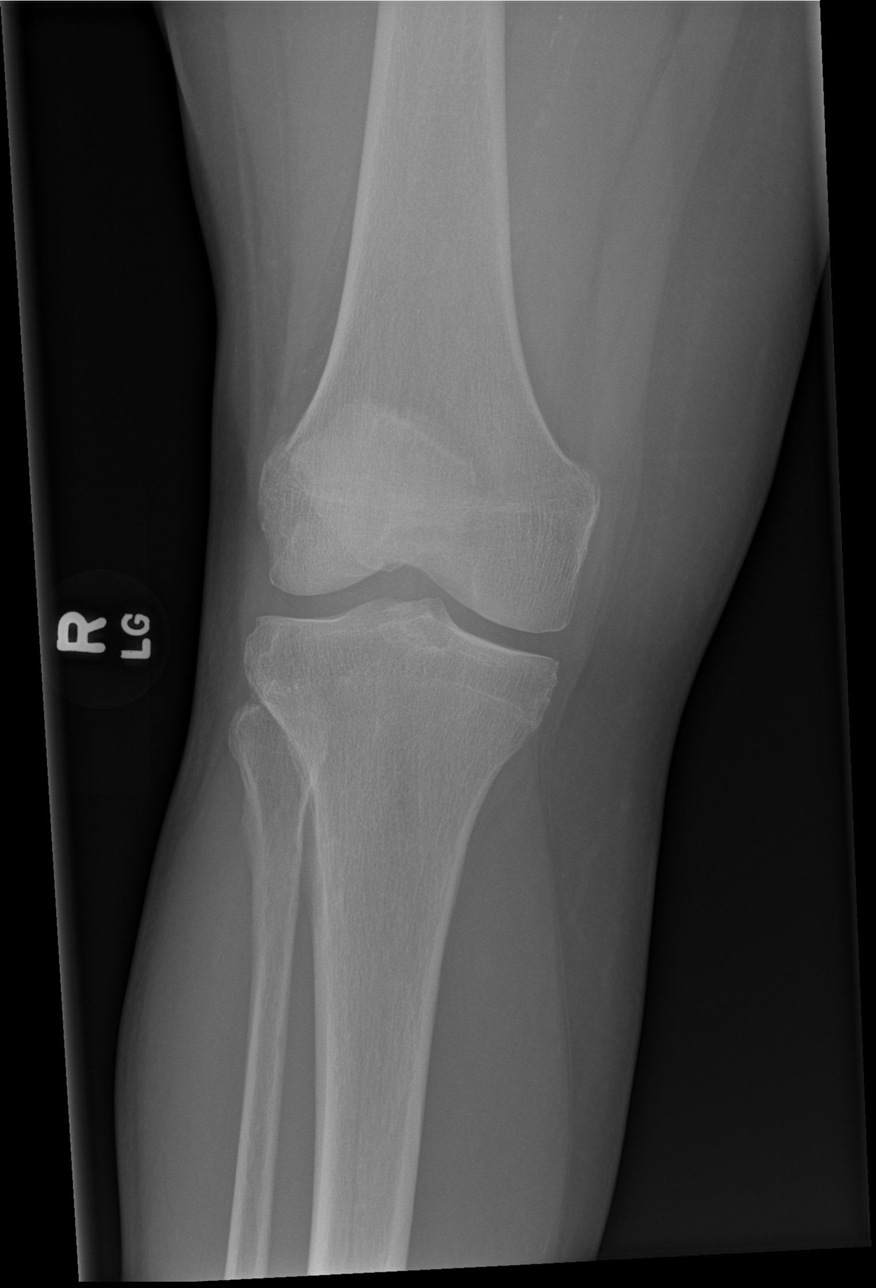

[x knee obl right (1 of 2)]
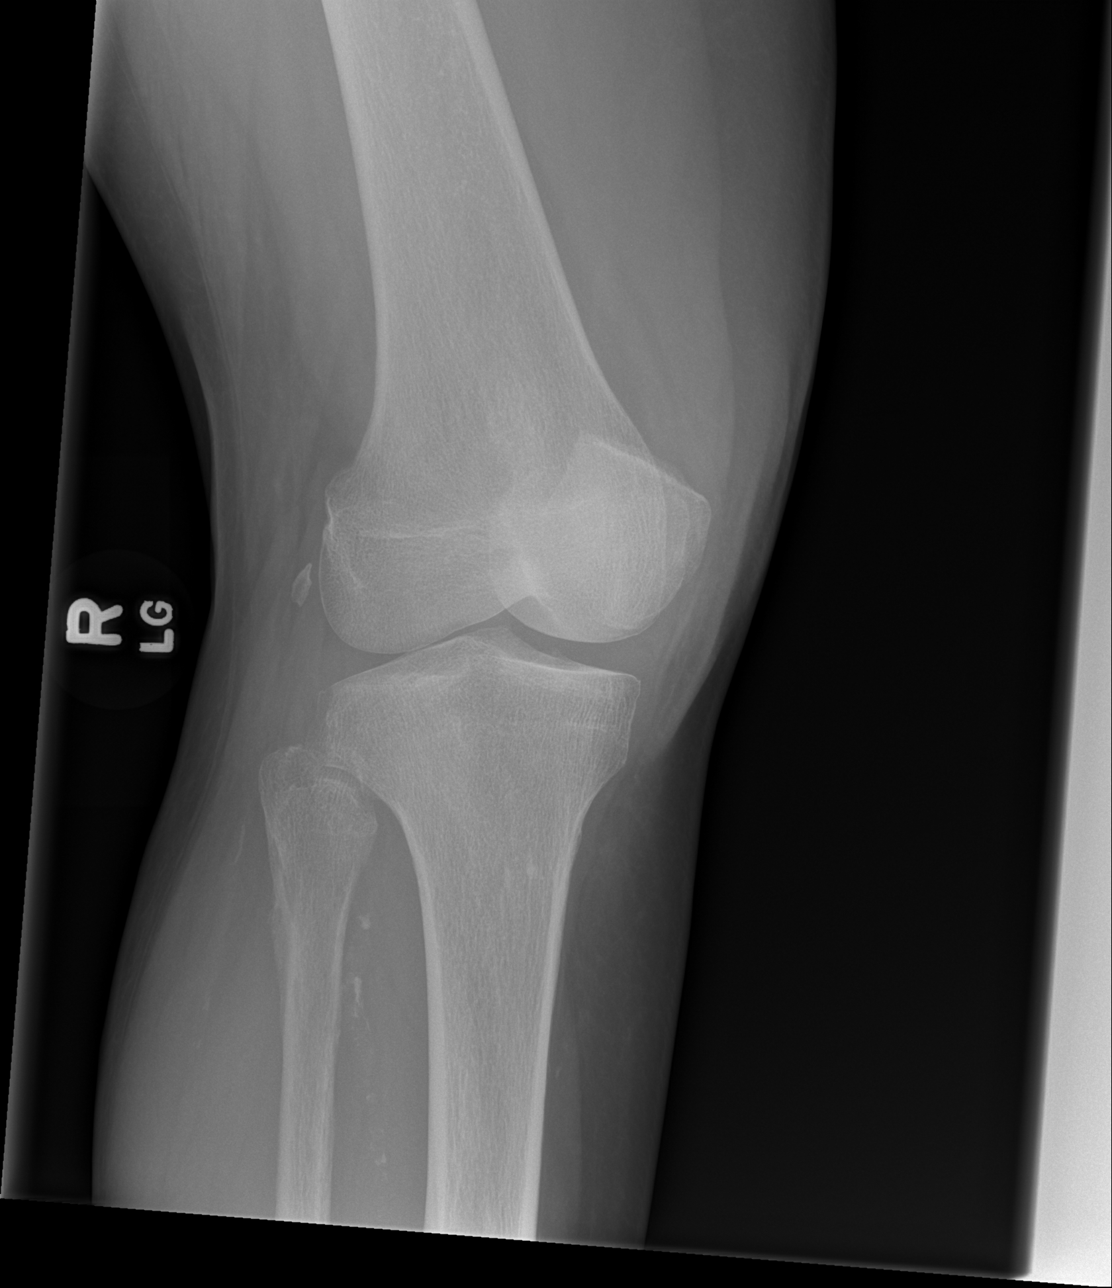

[x knee obl right (2 of 2)]
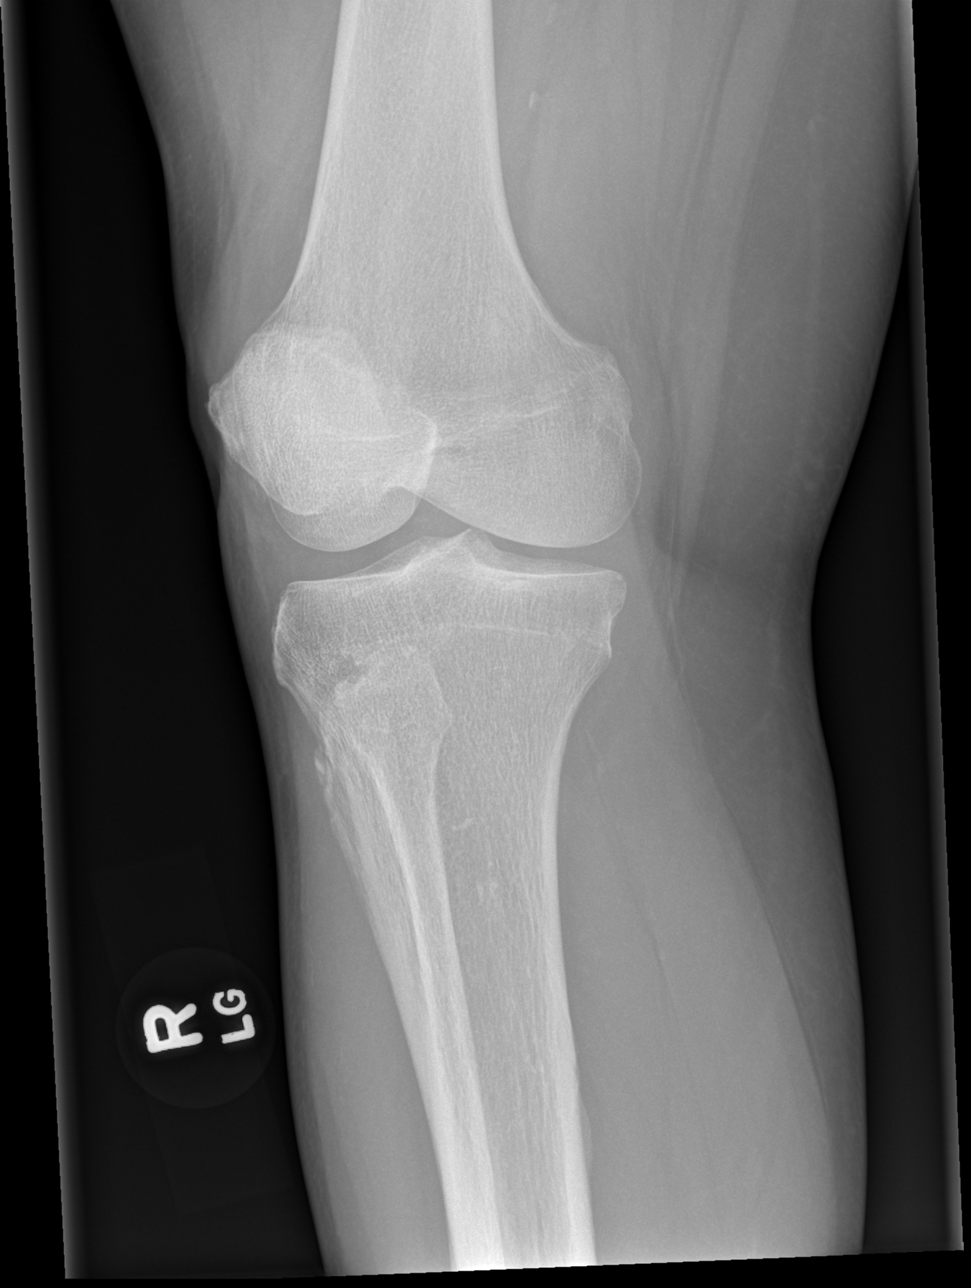

[x knee lat right]
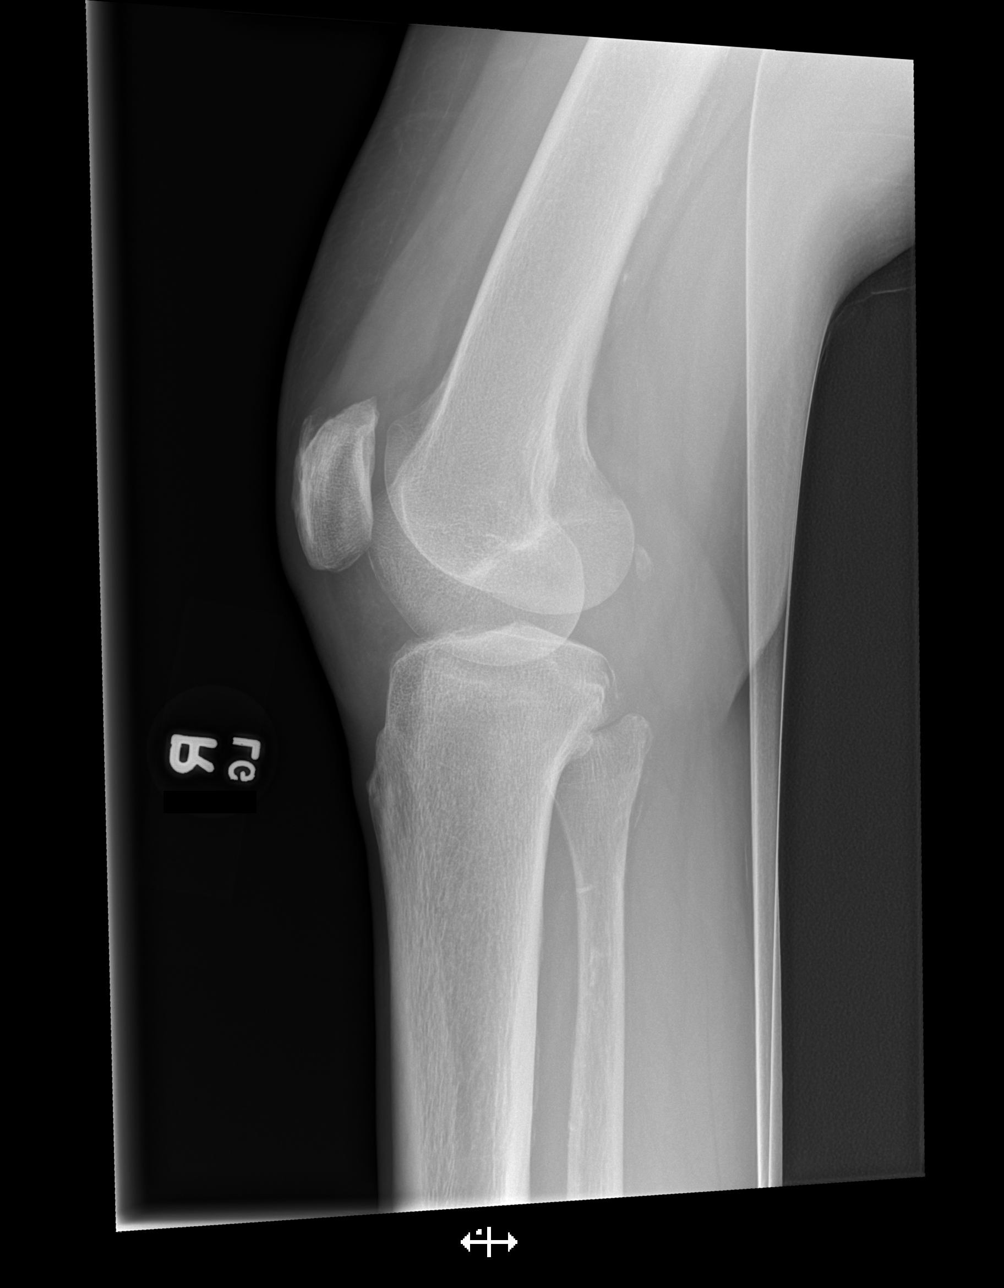

[4 of 4 positions shown; findings below may reference images not displayed]

FINDINGS: No evidence of fracture or dislocation. Small joint effusion. No
evidence of arthropathy or other focal bone abnormality. There is a
well corticated fragment adjacent to the lateral femoral condyle mL,
likely chronic. Soft tissues are unremarkable.
IMPRESSION: Small joint effusion without acute fracture or dislocation of the
right knee.

## 2019-10-10 ENCOUNTER — Ambulatory Visit
Admission: RE | Admit: 2019-10-10 | Discharge: 2019-10-10 | Disposition: A | Discharge status: Home / Self Care | Payer: Medicare Other | Source: Ambulatory Visit | Attending: Internal Medicine | Admitting: Internal Medicine

## 2019-10-10 ENCOUNTER — Other Ambulatory Visit: Payer: Self-pay | Admitting: Internal Medicine

## 2019-10-10 ENCOUNTER — Other Ambulatory Visit: Payer: Self-pay

## 2019-10-10 DIAGNOSIS — Z1231 Encounter for screening mammogram for malignant neoplasm of breast: Secondary | ICD-10-CM

## 2019-10-10 DIAGNOSIS — Z780 Asymptomatic menopausal state: Secondary | ICD-10-CM | POA: Diagnosis not present

## 2019-10-10 DIAGNOSIS — E2839 Other primary ovarian failure: Secondary | ICD-10-CM

## 2019-10-15 DIAGNOSIS — E782 Mixed hyperlipidemia: Secondary | ICD-10-CM | POA: Diagnosis not present

## 2019-10-15 DIAGNOSIS — E781 Pure hyperglyceridemia: Secondary | ICD-10-CM | POA: Diagnosis not present

## 2019-10-15 DIAGNOSIS — E7800 Pure hypercholesterolemia, unspecified: Secondary | ICD-10-CM | POA: Diagnosis not present

## 2019-10-15 DIAGNOSIS — E1140 Type 2 diabetes mellitus with diabetic neuropathy, unspecified: Secondary | ICD-10-CM | POA: Diagnosis not present

## 2019-11-11 ENCOUNTER — Telehealth: Payer: Self-pay | Admitting: Orthopaedic Surgery

## 2019-11-11 NOTE — Telephone Encounter (Signed)
Called patient left message to return call to reschedule her appointment      Provider will be out of the office in the afternoon    Note: Patient can reschedule in the morning

## 2019-11-13 DIAGNOSIS — E782 Mixed hyperlipidemia: Secondary | ICD-10-CM | POA: Diagnosis not present

## 2019-11-13 DIAGNOSIS — E1140 Type 2 diabetes mellitus with diabetic neuropathy, unspecified: Secondary | ICD-10-CM | POA: Diagnosis not present

## 2019-11-13 DIAGNOSIS — E781 Pure hyperglyceridemia: Secondary | ICD-10-CM | POA: Diagnosis not present

## 2019-11-13 DIAGNOSIS — E7800 Pure hypercholesterolemia, unspecified: Secondary | ICD-10-CM | POA: Diagnosis not present

## 2019-12-18 DIAGNOSIS — E7800 Pure hypercholesterolemia, unspecified: Secondary | ICD-10-CM | POA: Diagnosis not present

## 2019-12-18 DIAGNOSIS — E1140 Type 2 diabetes mellitus with diabetic neuropathy, unspecified: Secondary | ICD-10-CM | POA: Diagnosis not present

## 2019-12-18 DIAGNOSIS — E782 Mixed hyperlipidemia: Secondary | ICD-10-CM | POA: Diagnosis not present

## 2019-12-18 DIAGNOSIS — E781 Pure hyperglyceridemia: Secondary | ICD-10-CM | POA: Diagnosis not present

## 2019-12-30 DIAGNOSIS — E119 Type 2 diabetes mellitus without complications: Secondary | ICD-10-CM | POA: Diagnosis not present

## 2020-02-18 DIAGNOSIS — Z0001 Encounter for general adult medical examination with abnormal findings: Secondary | ICD-10-CM | POA: Diagnosis not present

## 2020-02-18 DIAGNOSIS — E1140 Type 2 diabetes mellitus with diabetic neuropathy, unspecified: Secondary | ICD-10-CM | POA: Diagnosis not present

## 2020-02-18 DIAGNOSIS — E782 Mixed hyperlipidemia: Secondary | ICD-10-CM | POA: Diagnosis not present

## 2020-02-18 DIAGNOSIS — I10 Essential (primary) hypertension: Secondary | ICD-10-CM | POA: Diagnosis not present

## 2020-02-25 DIAGNOSIS — E1140 Type 2 diabetes mellitus with diabetic neuropathy, unspecified: Secondary | ICD-10-CM | POA: Diagnosis not present

## 2020-02-25 DIAGNOSIS — E782 Mixed hyperlipidemia: Secondary | ICD-10-CM | POA: Diagnosis not present

## 2020-02-25 DIAGNOSIS — E7800 Pure hypercholesterolemia, unspecified: Secondary | ICD-10-CM | POA: Diagnosis not present

## 2020-02-25 DIAGNOSIS — E781 Pure hyperglyceridemia: Secondary | ICD-10-CM | POA: Diagnosis not present

## 2020-03-09 DIAGNOSIS — R200 Anesthesia of skin: Secondary | ICD-10-CM | POA: Diagnosis not present

## 2020-03-09 DIAGNOSIS — M19042 Primary osteoarthritis, left hand: Secondary | ICD-10-CM | POA: Diagnosis not present

## 2020-03-09 DIAGNOSIS — M1812 Unilateral primary osteoarthritis of first carpometacarpal joint, left hand: Secondary | ICD-10-CM | POA: Diagnosis not present

## 2020-03-09 DIAGNOSIS — R531 Weakness: Secondary | ICD-10-CM | POA: Diagnosis not present

## 2020-03-09 DIAGNOSIS — M79642 Pain in left hand: Secondary | ICD-10-CM | POA: Diagnosis not present

## 2020-03-22 ENCOUNTER — Ambulatory Visit
Admission: RE | Admit: 2020-03-22 | Discharge: 2020-03-22 | Disposition: A | Discharge status: Home / Self Care | Payer: Medicare Other | Source: Ambulatory Visit | Attending: Internal Medicine | Admitting: Internal Medicine

## 2020-03-22 ENCOUNTER — Other Ambulatory Visit: Payer: Self-pay

## 2020-03-22 DIAGNOSIS — Z1231 Encounter for screening mammogram for malignant neoplasm of breast: Secondary | ICD-10-CM

## 2020-04-08 DIAGNOSIS — Z20822 Contact with and (suspected) exposure to covid-19: Secondary | ICD-10-CM | POA: Diagnosis not present

## 2020-04-22 DIAGNOSIS — E782 Mixed hyperlipidemia: Secondary | ICD-10-CM | POA: Diagnosis not present

## 2020-04-22 DIAGNOSIS — E1140 Type 2 diabetes mellitus with diabetic neuropathy, unspecified: Secondary | ICD-10-CM | POA: Diagnosis not present

## 2020-04-22 DIAGNOSIS — I10 Essential (primary) hypertension: Secondary | ICD-10-CM | POA: Diagnosis not present

## 2020-04-22 DIAGNOSIS — E7800 Pure hypercholesterolemia, unspecified: Secondary | ICD-10-CM | POA: Diagnosis not present

## 2020-05-12 ENCOUNTER — Ambulatory Visit: Payer: Medicare Other | Admitting: Orthopaedic Surgery

## 2020-05-19 ENCOUNTER — Ambulatory Visit: Payer: Medicare Other | Admitting: Orthopaedic Surgery

## 2020-05-19 ENCOUNTER — Encounter: Payer: Self-pay | Admitting: Orthopaedic Surgery

## 2020-05-19 ENCOUNTER — Ambulatory Visit: Payer: Self-pay

## 2020-05-19 DIAGNOSIS — Z96652 Presence of left artificial knee joint: Secondary | ICD-10-CM

## 2020-05-19 NOTE — Progress Notes (Signed)
   Post-Op Visit Note   Patient: Robyn [name]           Date of Birth: 1945-11-15           MRN: [id number] Visit Date: 05/19/2020 PCP: Marden Noble, MD   Assessment & Plan:  Chief Complaint:  Chief Complaint  Patient presents with  . Left Knee - Pain   Visit Diagnoses:  1. S/P TKR (total knee replacement), left     Plan: Quanika is a pleasant 75 year old female who comes in today 2 years out left total knee replacement.  She has been doing excellent.  The only complaint she has is that she still has a weird feeling when trying to kneel on her left knee.  Otherwise doing excellent.  Examination of her left knee reveals range of motion from 0 to 120 degrees.  She is stable valgus varus stress.  She is neurovascular intact distally.  At this point, she will continue to advance with activity.  She will follow up with [place] in 1 to 2 years for repeat evaluation and 2 view x-rays of the left knee.  Call with concerns or questions.  Follow-Up Instructions: Return in about 1 year (around 05/19/2021).   Orders:  Orders Placed This Encounter  Procedures  . XR KNEE 3 VIEW LEFT   No orders of the defined types were placed in this encounter.   Imaging: XR KNEE 3 VIEW LEFT  Result Date: 05/19/2020 Well-seated prosthesis without complication   PMFS History: Patient Active Problem List   Diagnosis Date Noted  . Plantar fasciitis of left foot 05/13/2019  . S/P TKR (total knee replacement), left 04/29/2018  . Primary osteoarthritis of left knee 10/03/2017  . Bilateral primary osteoarthritis of knee 04/30/2017  . Pleuritic chest pain 06/29/2012  . Diabetes mellitus type 2 without retinopathy (HCC) 06/29/2012  . Hypertension 06/29/2012   Past Medical History:  Diagnosis Date  . Arthritis   . Back pain   . Diabetes mellitus without complication (HCC)   . GERD (gastroesophageal reflux disease)   . Heart murmur   . Hypertension     Family History  Problem Relation Age of Onset   . Alzheimer's disease Mother   . Heart attack Father   . Breast cancer Neg Hx     Past Surgical History:  Procedure Laterality Date  . ABDOMINAL HYSTERECTOMY    . BACK SURGERY     L4-L5  . CARPAL TUNNEL RELEASE Bilateral   . KNEE CLOSED REDUCTION Left 07/17/2018   Procedure: CLOSED MANIPULATION LEFT KNEE;  Surgeon: Tarry Kos, MD;  Location: [facility];  Service: Orthopedics;  Laterality: Left;  . TONSILLECTOMY    . TOTAL KNEE ARTHROPLASTY Left 04/29/2018   Procedure: LEFT TOTAL KNEE ARTHROPLASTY;  Surgeon: Tarry Kos, MD;  Location: [facility];  Service: Orthopedics;  Laterality: Left;   Social History   Occupational History  . Not on file  Tobacco Use  . Smoking status: Never Smoker  . Smokeless tobacco: Never Used  Vaping Use  . Vaping Use: Not on file  Substance and Sexual Activity  . Alcohol use: Yes    Alcohol/week: 3.0 standard drinks    Types: 2 Glasses of wine, 1 Shots of liquor per week    Comment: social drinker  . Drug use: No  . Sexual activity: Never

## 2020-06-02 DIAGNOSIS — G5602 Carpal tunnel syndrome, left upper limb: Secondary | ICD-10-CM | POA: Diagnosis not present

## 2020-06-03 DIAGNOSIS — M1812 Unilateral primary osteoarthritis of first carpometacarpal joint, left hand: Secondary | ICD-10-CM | POA: Diagnosis not present

## 2020-06-03 DIAGNOSIS — G5602 Carpal tunnel syndrome, left upper limb: Secondary | ICD-10-CM | POA: Diagnosis not present

## 2020-06-03 DIAGNOSIS — M79642 Pain in left hand: Secondary | ICD-10-CM | POA: Diagnosis not present

## 2020-06-10 IMAGING — MG DIGITAL SCREENING BILATERAL MAMMOGRAM WITH TOMO AND CAD
8 series · 8 of 24 positions shown · non-contrast
Comparison: Previous exam(s).

CLINICAL DATA: Screening.

EXAM:
DIGITAL SCREENING BILATERAL MAMMOGRAM WITH TOMO AND CAD

[R CC synth-2D]
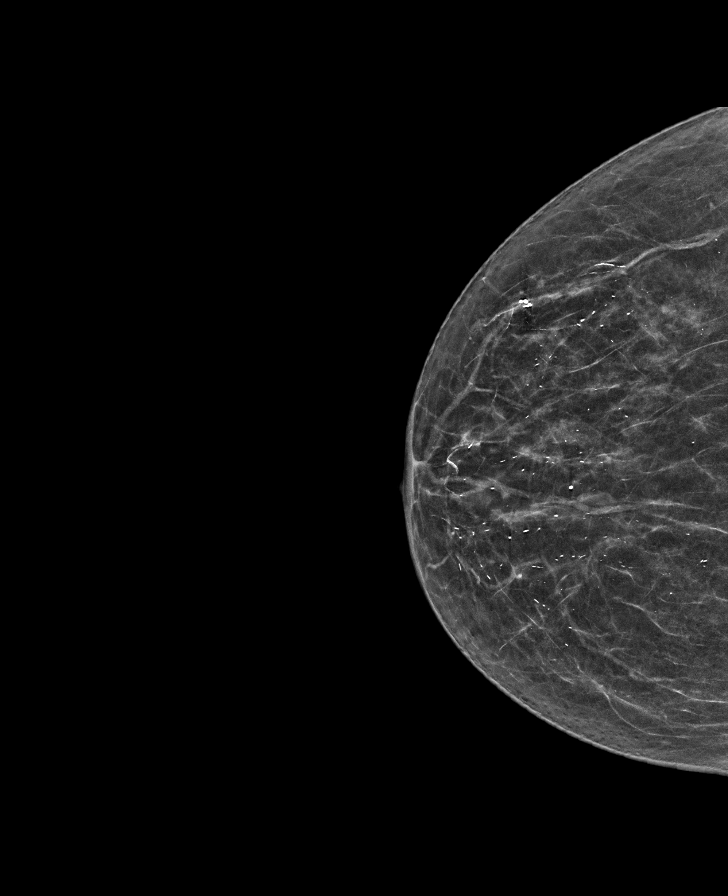

[L CC synth-2D]
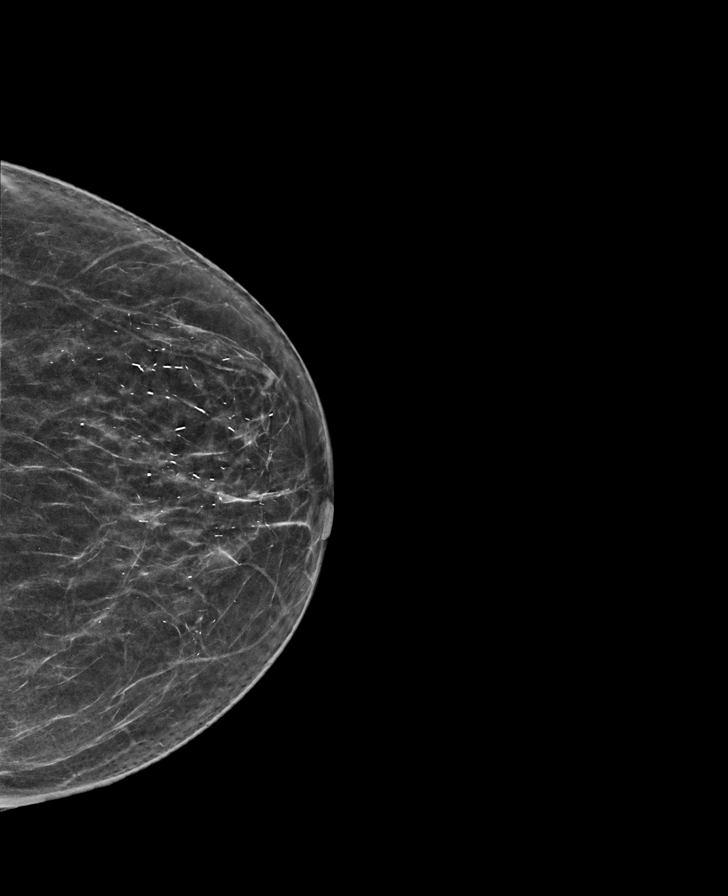

[R MLO synth-2D]
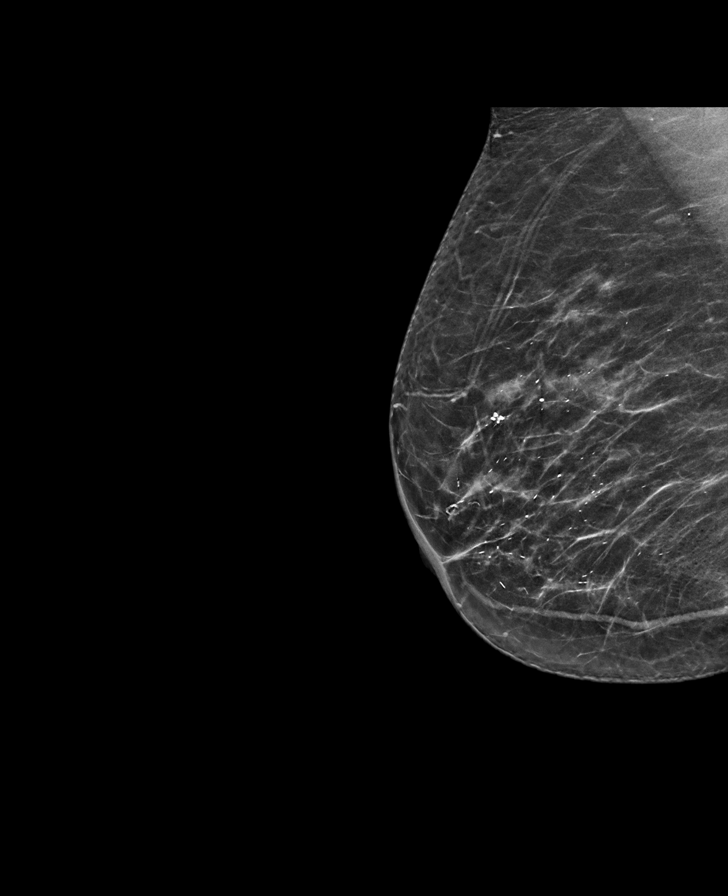

[L MLO synth-2D]
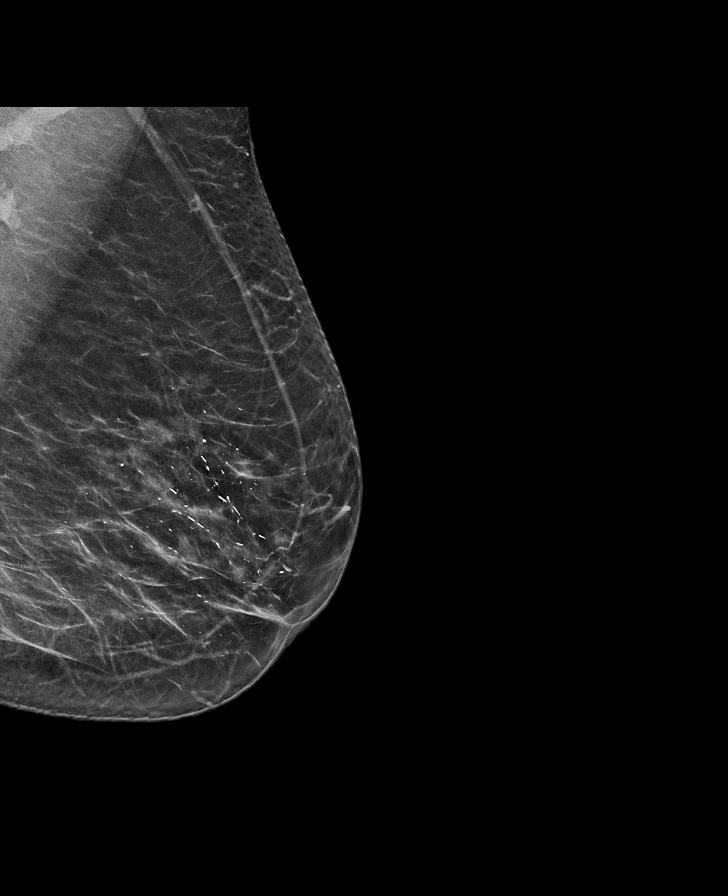

[R CC tomo · tomo slice 29/57.0]
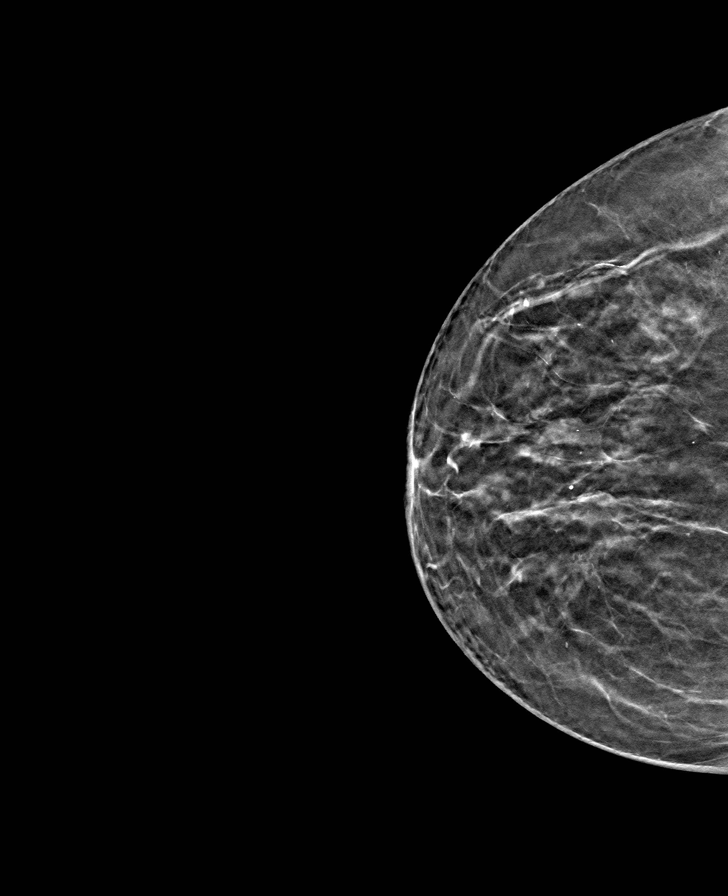

[R MLO tomo · tomo slice 33/64.0]
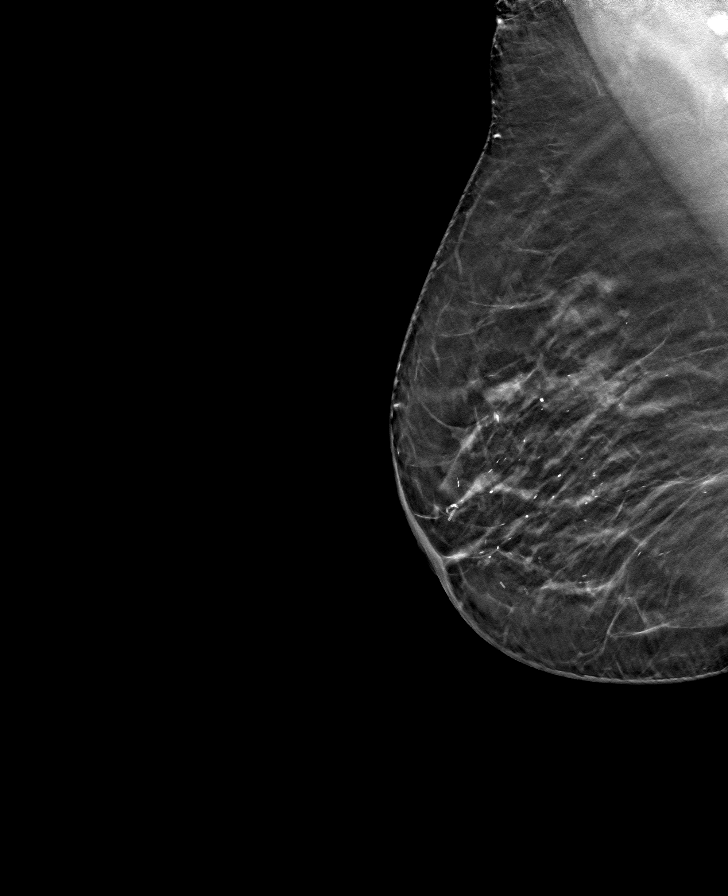

[L CC tomo · tomo slice 31/61.0]
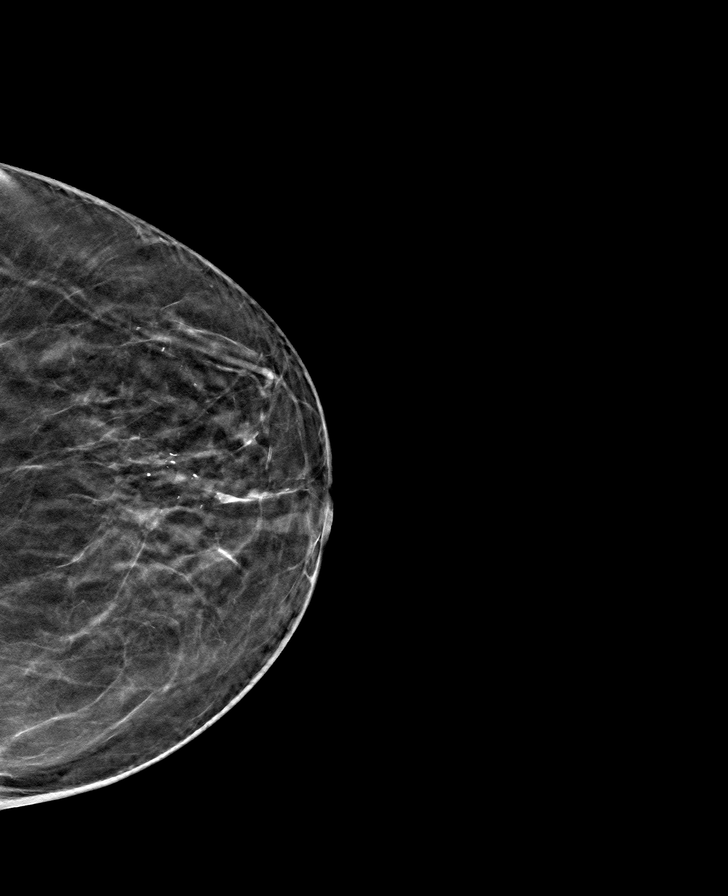

[L MLO tomo · tomo slice 35/68.0]
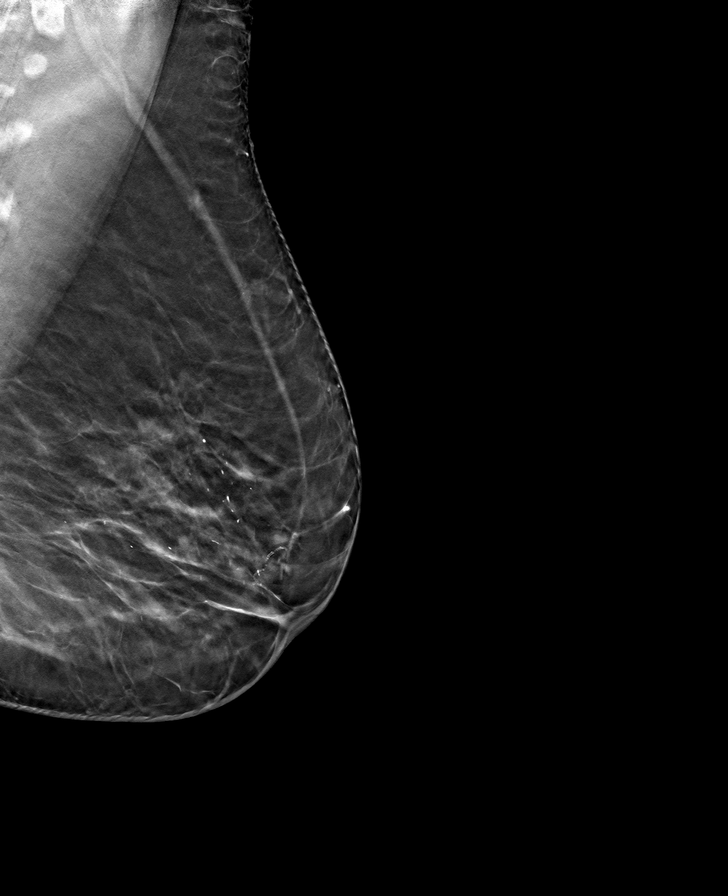

[8 of 24 positions shown; findings below may reference images not displayed]

ACR Breast Density Category b: There are scattered areas of
fibroglandular density.
FINDINGS: There are no findings suspicious for malignancy. Images were
processed with CAD.
IMPRESSION: No mammographic evidence of malignancy. A result letter of this
screening mammogram will be mailed directly to the patient.

RECOMMENDATION:
Screening mammogram in one year. (Code:[id number]-775)

BI-RADS CATEGORY  1: Negative.

## 2020-06-21 DIAGNOSIS — E782 Mixed hyperlipidemia: Secondary | ICD-10-CM | POA: Diagnosis not present

## 2020-06-21 DIAGNOSIS — E1140 Type 2 diabetes mellitus with diabetic neuropathy, unspecified: Secondary | ICD-10-CM | POA: Diagnosis not present

## 2020-06-21 DIAGNOSIS — K219 Gastro-esophageal reflux disease without esophagitis: Secondary | ICD-10-CM | POA: Diagnosis not present

## 2020-06-21 DIAGNOSIS — I10 Essential (primary) hypertension: Secondary | ICD-10-CM | POA: Diagnosis not present

## 2020-07-27 DIAGNOSIS — I10 Essential (primary) hypertension: Secondary | ICD-10-CM | POA: Diagnosis not present

## 2020-07-27 DIAGNOSIS — E782 Mixed hyperlipidemia: Secondary | ICD-10-CM | POA: Diagnosis not present

## 2020-07-27 DIAGNOSIS — M170 Bilateral primary osteoarthritis of knee: Secondary | ICD-10-CM | POA: Diagnosis not present

## 2020-07-27 DIAGNOSIS — E1140 Type 2 diabetes mellitus with diabetic neuropathy, unspecified: Secondary | ICD-10-CM | POA: Diagnosis not present

## 2020-07-28 DIAGNOSIS — G8918 Other acute postprocedural pain: Secondary | ICD-10-CM | POA: Diagnosis not present

## 2020-07-28 DIAGNOSIS — M1812 Unilateral primary osteoarthritis of first carpometacarpal joint, left hand: Secondary | ICD-10-CM | POA: Diagnosis not present

## 2020-07-28 DIAGNOSIS — M180 Bilateral primary osteoarthritis of first carpometacarpal joints: Secondary | ICD-10-CM | POA: Diagnosis not present

## 2020-07-28 DIAGNOSIS — M1811 Unilateral primary osteoarthritis of first carpometacarpal joint, right hand: Secondary | ICD-10-CM | POA: Diagnosis not present

## 2020-07-28 DIAGNOSIS — G5602 Carpal tunnel syndrome, left upper limb: Secondary | ICD-10-CM | POA: Diagnosis not present

## 2020-08-06 DIAGNOSIS — I10 Essential (primary) hypertension: Secondary | ICD-10-CM | POA: Diagnosis not present

## 2020-08-06 DIAGNOSIS — E1140 Type 2 diabetes mellitus with diabetic neuropathy, unspecified: Secondary | ICD-10-CM | POA: Diagnosis not present

## 2020-08-06 DIAGNOSIS — K219 Gastro-esophageal reflux disease without esophagitis: Secondary | ICD-10-CM | POA: Diagnosis not present

## 2020-08-06 DIAGNOSIS — E782 Mixed hyperlipidemia: Secondary | ICD-10-CM | POA: Diagnosis not present

## 2020-08-27 DIAGNOSIS — E1165 Type 2 diabetes mellitus with hyperglycemia: Secondary | ICD-10-CM | POA: Diagnosis not present

## 2020-08-27 DIAGNOSIS — I10 Essential (primary) hypertension: Secondary | ICD-10-CM | POA: Diagnosis not present

## 2020-08-27 DIAGNOSIS — Z794 Long term (current) use of insulin: Secondary | ICD-10-CM | POA: Diagnosis not present

## 2020-09-06 DIAGNOSIS — E7800 Pure hypercholesterolemia, unspecified: Secondary | ICD-10-CM | POA: Diagnosis not present

## 2020-09-06 DIAGNOSIS — E1140 Type 2 diabetes mellitus with diabetic neuropathy, unspecified: Secondary | ICD-10-CM | POA: Diagnosis not present

## 2020-09-06 DIAGNOSIS — E782 Mixed hyperlipidemia: Secondary | ICD-10-CM | POA: Diagnosis not present

## 2020-09-06 DIAGNOSIS — E781 Pure hyperglyceridemia: Secondary | ICD-10-CM | POA: Diagnosis not present

## 2020-10-27 DIAGNOSIS — E7800 Pure hypercholesterolemia, unspecified: Secondary | ICD-10-CM | POA: Diagnosis not present

## 2020-10-27 DIAGNOSIS — E1165 Type 2 diabetes mellitus with hyperglycemia: Secondary | ICD-10-CM | POA: Diagnosis not present

## 2020-10-27 DIAGNOSIS — E1140 Type 2 diabetes mellitus with diabetic neuropathy, unspecified: Secondary | ICD-10-CM | POA: Diagnosis not present

## 2020-10-27 DIAGNOSIS — I10 Essential (primary) hypertension: Secondary | ICD-10-CM | POA: Diagnosis not present

## 2020-11-01 DIAGNOSIS — Z7984 Long term (current) use of oral hypoglycemic drugs: Secondary | ICD-10-CM | POA: Diagnosis not present

## 2020-11-01 DIAGNOSIS — I10 Essential (primary) hypertension: Secondary | ICD-10-CM | POA: Diagnosis not present

## 2020-11-01 DIAGNOSIS — E782 Mixed hyperlipidemia: Secondary | ICD-10-CM | POA: Diagnosis not present

## 2020-11-01 DIAGNOSIS — E1165 Type 2 diabetes mellitus with hyperglycemia: Secondary | ICD-10-CM | POA: Diagnosis not present

## 2020-11-01 DIAGNOSIS — E1140 Type 2 diabetes mellitus with diabetic neuropathy, unspecified: Secondary | ICD-10-CM | POA: Diagnosis not present

## 2020-11-04 DIAGNOSIS — E1165 Type 2 diabetes mellitus with hyperglycemia: Secondary | ICD-10-CM | POA: Diagnosis not present

## 2020-11-04 DIAGNOSIS — E1140 Type 2 diabetes mellitus with diabetic neuropathy, unspecified: Secondary | ICD-10-CM | POA: Diagnosis not present

## 2020-11-04 DIAGNOSIS — Z7984 Long term (current) use of oral hypoglycemic drugs: Secondary | ICD-10-CM | POA: Diagnosis not present

## 2020-11-16 DIAGNOSIS — E119 Type 2 diabetes mellitus without complications: Secondary | ICD-10-CM | POA: Diagnosis not present

## 2020-12-02 DIAGNOSIS — Z7984 Long term (current) use of oral hypoglycemic drugs: Secondary | ICD-10-CM | POA: Diagnosis not present

## 2020-12-02 DIAGNOSIS — E1165 Type 2 diabetes mellitus with hyperglycemia: Secondary | ICD-10-CM | POA: Diagnosis not present

## 2020-12-02 DIAGNOSIS — E1140 Type 2 diabetes mellitus with diabetic neuropathy, unspecified: Secondary | ICD-10-CM | POA: Diagnosis not present

## 2021-02-22 DIAGNOSIS — E782 Mixed hyperlipidemia: Secondary | ICD-10-CM | POA: Diagnosis not present

## 2021-02-22 DIAGNOSIS — E1140 Type 2 diabetes mellitus with diabetic neuropathy, unspecified: Secondary | ICD-10-CM | POA: Diagnosis not present

## 2021-02-22 DIAGNOSIS — I10 Essential (primary) hypertension: Secondary | ICD-10-CM | POA: Diagnosis not present

## 2021-02-22 DIAGNOSIS — Z0001 Encounter for general adult medical examination with abnormal findings: Secondary | ICD-10-CM | POA: Diagnosis not present

## 2021-02-22 DIAGNOSIS — E559 Vitamin D deficiency, unspecified: Secondary | ICD-10-CM | POA: Diagnosis not present

## 2021-03-02 ENCOUNTER — Other Ambulatory Visit: Payer: Self-pay | Admitting: Internal Medicine

## 2021-03-02 DIAGNOSIS — Z1231 Encounter for screening mammogram for malignant neoplasm of breast: Secondary | ICD-10-CM

## 2021-04-01 ENCOUNTER — Ambulatory Visit
Admission: RE | Admit: 2021-04-01 | Discharge: 2021-04-01 | Disposition: A | Discharge status: Home / Self Care | Payer: Medicare Other | Source: Ambulatory Visit | Attending: Internal Medicine | Admitting: Internal Medicine

## 2021-04-01 ENCOUNTER — Other Ambulatory Visit: Payer: Self-pay

## 2021-04-01 DIAGNOSIS — Z1231 Encounter for screening mammogram for malignant neoplasm of breast: Secondary | ICD-10-CM

## 2021-04-08 DIAGNOSIS — I10 Essential (primary) hypertension: Secondary | ICD-10-CM | POA: Diagnosis not present

## 2021-10-10 DIAGNOSIS — I10 Essential (primary) hypertension: Secondary | ICD-10-CM | POA: Diagnosis not present

## 2021-10-10 DIAGNOSIS — E1140 Type 2 diabetes mellitus with diabetic neuropathy, unspecified: Secondary | ICD-10-CM | POA: Diagnosis not present

## 2021-10-13 DIAGNOSIS — B349 Viral infection, unspecified: Secondary | ICD-10-CM | POA: Diagnosis not present

## 2021-10-13 DIAGNOSIS — H00014 Hordeolum externum left upper eyelid: Secondary | ICD-10-CM | POA: Diagnosis not present

## 2021-10-18 DIAGNOSIS — I10 Essential (primary) hypertension: Secondary | ICD-10-CM | POA: Diagnosis not present

## 2021-10-18 DIAGNOSIS — E1140 Type 2 diabetes mellitus with diabetic neuropathy, unspecified: Secondary | ICD-10-CM | POA: Diagnosis not present

## 2021-12-06 DIAGNOSIS — B354 Tinea corporis: Secondary | ICD-10-CM | POA: Diagnosis not present

## 2021-12-06 DIAGNOSIS — N3281 Overactive bladder: Secondary | ICD-10-CM | POA: Diagnosis not present

## 2021-12-06 DIAGNOSIS — E1140 Type 2 diabetes mellitus with diabetic neuropathy, unspecified: Secondary | ICD-10-CM | POA: Diagnosis not present

## 2021-12-06 DIAGNOSIS — I10 Essential (primary) hypertension: Secondary | ICD-10-CM | POA: Diagnosis not present

## 2022-01-20 DIAGNOSIS — N3281 Overactive bladder: Secondary | ICD-10-CM | POA: Diagnosis not present

## 2022-01-20 DIAGNOSIS — I10 Essential (primary) hypertension: Secondary | ICD-10-CM | POA: Diagnosis not present

## 2022-01-20 DIAGNOSIS — B354 Tinea corporis: Secondary | ICD-10-CM | POA: Diagnosis not present

## 2022-01-20 DIAGNOSIS — E1140 Type 2 diabetes mellitus with diabetic neuropathy, unspecified: Secondary | ICD-10-CM | POA: Diagnosis not present

## 2022-04-11 ENCOUNTER — Other Ambulatory Visit: Payer: Self-pay | Admitting: Internal Medicine

## 2022-04-11 DIAGNOSIS — Z1231 Encounter for screening mammogram for malignant neoplasm of breast: Secondary | ICD-10-CM

## 2022-05-11 DIAGNOSIS — I10 Essential (primary) hypertension: Secondary | ICD-10-CM | POA: Diagnosis not present

## 2022-05-11 DIAGNOSIS — Z79899 Other long term (current) drug therapy: Secondary | ICD-10-CM | POA: Diagnosis not present

## 2022-05-11 DIAGNOSIS — Z1331 Encounter for screening for depression: Secondary | ICD-10-CM | POA: Diagnosis not present

## 2022-05-11 DIAGNOSIS — E1122 Type 2 diabetes mellitus with diabetic chronic kidney disease: Secondary | ICD-10-CM | POA: Diagnosis not present

## 2022-05-11 DIAGNOSIS — E559 Vitamin D deficiency, unspecified: Secondary | ICD-10-CM | POA: Diagnosis not present

## 2022-05-11 DIAGNOSIS — N1832 Chronic kidney disease, stage 3b: Secondary | ICD-10-CM | POA: Diagnosis not present

## 2022-05-11 DIAGNOSIS — E1140 Type 2 diabetes mellitus with diabetic neuropathy, unspecified: Secondary | ICD-10-CM | POA: Diagnosis not present

## 2022-05-11 DIAGNOSIS — Z0000 Encounter for general adult medical examination without abnormal findings: Secondary | ICD-10-CM | POA: Diagnosis not present

## 2022-05-11 DIAGNOSIS — E7800 Pure hypercholesterolemia, unspecified: Secondary | ICD-10-CM | POA: Diagnosis not present

## 2022-06-02 ENCOUNTER — Ambulatory Visit
Admission: RE | Admit: 2022-06-02 | Discharge: 2022-06-02 | Disposition: A | Discharge status: Home / Self Care | Payer: Medicare Other | Source: Ambulatory Visit | Attending: Internal Medicine | Admitting: Internal Medicine

## 2022-06-02 DIAGNOSIS — Z1231 Encounter for screening mammogram for malignant neoplasm of breast: Secondary | ICD-10-CM | POA: Diagnosis not present

## 2022-06-07 ENCOUNTER — Other Ambulatory Visit: Payer: Self-pay | Admitting: Internal Medicine

## 2022-06-07 DIAGNOSIS — M8580 Other specified disorders of bone density and structure, unspecified site: Secondary | ICD-10-CM

## 2022-09-08 DIAGNOSIS — E7800 Pure hypercholesterolemia, unspecified: Secondary | ICD-10-CM | POA: Diagnosis not present

## 2022-09-08 DIAGNOSIS — E1140 Type 2 diabetes mellitus with diabetic neuropathy, unspecified: Secondary | ICD-10-CM | POA: Diagnosis not present

## 2022-09-08 DIAGNOSIS — I10 Essential (primary) hypertension: Secondary | ICD-10-CM | POA: Diagnosis not present

## 2022-09-08 DIAGNOSIS — E1165 Type 2 diabetes mellitus with hyperglycemia: Secondary | ICD-10-CM | POA: Diagnosis not present

## 2022-09-08 DIAGNOSIS — E1122 Type 2 diabetes mellitus with diabetic chronic kidney disease: Secondary | ICD-10-CM | POA: Diagnosis not present

## 2022-09-08 DIAGNOSIS — N1832 Chronic kidney disease, stage 3b: Secondary | ICD-10-CM | POA: Diagnosis not present

## 2022-09-15 DIAGNOSIS — I6523 Occlusion and stenosis of bilateral carotid arteries: Secondary | ICD-10-CM | POA: Diagnosis not present

## 2022-11-17 ENCOUNTER — Ambulatory Visit
Admission: RE | Admit: 2022-11-17 | Discharge: 2022-11-17 | Disposition: A | Discharge status: Home / Self Care | Payer: Medicare Other | Source: Ambulatory Visit | Attending: Internal Medicine | Admitting: Internal Medicine

## 2022-11-17 DIAGNOSIS — E2839 Other primary ovarian failure: Secondary | ICD-10-CM | POA: Diagnosis not present

## 2022-11-17 DIAGNOSIS — M8580 Other specified disorders of bone density and structure, unspecified site: Secondary | ICD-10-CM

## 2022-11-17 DIAGNOSIS — N958 Other specified menopausal and perimenopausal disorders: Secondary | ICD-10-CM | POA: Diagnosis not present

## 2022-11-17 DIAGNOSIS — M1990 Unspecified osteoarthritis, unspecified site: Secondary | ICD-10-CM | POA: Diagnosis not present

## 2022-11-23 DIAGNOSIS — R051 Acute cough: Secondary | ICD-10-CM | POA: Diagnosis not present

## 2022-11-23 DIAGNOSIS — J069 Acute upper respiratory infection, unspecified: Secondary | ICD-10-CM | POA: Diagnosis not present

## 2022-11-23 DIAGNOSIS — Z03818 Encounter for observation for suspected exposure to other biological agents ruled out: Secondary | ICD-10-CM | POA: Diagnosis not present

## 2023-01-05 DIAGNOSIS — L821 Other seborrheic keratosis: Secondary | ICD-10-CM | POA: Diagnosis not present

## 2023-01-05 DIAGNOSIS — L538 Other specified erythematous conditions: Secondary | ICD-10-CM | POA: Diagnosis not present

## 2023-01-05 DIAGNOSIS — L298 Other pruritus: Secondary | ICD-10-CM | POA: Diagnosis not present

## 2023-01-05 DIAGNOSIS — L720 Epidermal cyst: Secondary | ICD-10-CM | POA: Diagnosis not present

## 2023-01-05 DIAGNOSIS — D2239 Melanocytic nevi of other parts of face: Secondary | ICD-10-CM | POA: Diagnosis not present

## 2023-01-05 DIAGNOSIS — L309 Dermatitis, unspecified: Secondary | ICD-10-CM | POA: Diagnosis not present

## 2023-01-05 DIAGNOSIS — L820 Inflamed seborrheic keratosis: Secondary | ICD-10-CM | POA: Diagnosis not present

## 2023-01-12 ENCOUNTER — Ambulatory Visit
Admission: RE | Admit: 2023-01-12 | Discharge: 2023-01-12 | Disposition: A | Discharge status: Home / Self Care | Payer: Medicare Other | Source: Ambulatory Visit | Attending: Internal Medicine | Admitting: Internal Medicine

## 2023-01-12 ENCOUNTER — Other Ambulatory Visit: Payer: Self-pay | Admitting: Internal Medicine

## 2023-01-12 DIAGNOSIS — I10 Essential (primary) hypertension: Secondary | ICD-10-CM | POA: Diagnosis not present

## 2023-01-12 DIAGNOSIS — M79672 Pain in left foot: Secondary | ICD-10-CM

## 2023-01-12 DIAGNOSIS — E1165 Type 2 diabetes mellitus with hyperglycemia: Secondary | ICD-10-CM | POA: Diagnosis not present

## 2023-02-22 DIAGNOSIS — H524 Presbyopia: Secondary | ICD-10-CM | POA: Diagnosis not present

## 2023-02-22 DIAGNOSIS — E119 Type 2 diabetes mellitus without complications: Secondary | ICD-10-CM | POA: Diagnosis not present

## 2023-03-02 DIAGNOSIS — L814 Other melanin hyperpigmentation: Secondary | ICD-10-CM | POA: Diagnosis not present

## 2023-03-02 DIAGNOSIS — L309 Dermatitis, unspecified: Secondary | ICD-10-CM | POA: Diagnosis not present

## 2023-03-02 DIAGNOSIS — L720 Epidermal cyst: Secondary | ICD-10-CM | POA: Diagnosis not present

## 2023-03-02 DIAGNOSIS — L821 Other seborrheic keratosis: Secondary | ICD-10-CM | POA: Diagnosis not present

## 2023-05-18 DIAGNOSIS — Z79899 Other long term (current) drug therapy: Secondary | ICD-10-CM | POA: Diagnosis not present

## 2023-05-18 DIAGNOSIS — Z23 Encounter for immunization: Secondary | ICD-10-CM | POA: Diagnosis not present

## 2023-05-18 DIAGNOSIS — E1165 Type 2 diabetes mellitus with hyperglycemia: Secondary | ICD-10-CM | POA: Diagnosis not present

## 2023-05-18 DIAGNOSIS — E559 Vitamin D deficiency, unspecified: Secondary | ICD-10-CM | POA: Diagnosis not present

## 2023-05-18 DIAGNOSIS — Z1331 Encounter for screening for depression: Secondary | ICD-10-CM | POA: Diagnosis not present

## 2023-05-18 DIAGNOSIS — Z0000 Encounter for general adult medical examination without abnormal findings: Secondary | ICD-10-CM | POA: Diagnosis not present

## 2023-05-18 DIAGNOSIS — E7800 Pure hypercholesterolemia, unspecified: Secondary | ICD-10-CM | POA: Diagnosis not present

## 2023-05-18 DIAGNOSIS — N1832 Chronic kidney disease, stage 3b: Secondary | ICD-10-CM | POA: Diagnosis not present

## 2023-06-26 ENCOUNTER — Other Ambulatory Visit: Payer: Self-pay | Admitting: Internal Medicine

## 2023-06-26 DIAGNOSIS — Z0000 Encounter for general adult medical examination without abnormal findings: Secondary | ICD-10-CM

## 2023-07-02 DIAGNOSIS — R197 Diarrhea, unspecified: Secondary | ICD-10-CM | POA: Diagnosis not present

## 2023-07-04 DIAGNOSIS — Z09 Encounter for follow-up examination after completed treatment for conditions other than malignant neoplasm: Secondary | ICD-10-CM | POA: Diagnosis not present

## 2023-07-04 DIAGNOSIS — K6389 Other specified diseases of intestine: Secondary | ICD-10-CM | POA: Diagnosis not present

## 2023-07-04 DIAGNOSIS — Z860101 Personal history of adenomatous and serrated colon polyps: Secondary | ICD-10-CM | POA: Diagnosis not present

## 2023-07-04 DIAGNOSIS — D123 Benign neoplasm of transverse colon: Secondary | ICD-10-CM | POA: Diagnosis not present

## 2023-07-06 ENCOUNTER — Ambulatory Visit
Admission: RE | Admit: 2023-07-06 | Discharge: 2023-07-06 | Disposition: A | Discharge status: Home / Self Care | Source: Ambulatory Visit | Attending: Internal Medicine | Admitting: Internal Medicine

## 2023-07-06 DIAGNOSIS — Z1231 Encounter for screening mammogram for malignant neoplasm of breast: Secondary | ICD-10-CM | POA: Diagnosis not present

## 2023-07-06 DIAGNOSIS — Z0000 Encounter for general adult medical examination without abnormal findings: Secondary | ICD-10-CM

## 2023-09-13 DIAGNOSIS — E1140 Type 2 diabetes mellitus with diabetic neuropathy, unspecified: Secondary | ICD-10-CM | POA: Diagnosis not present

## 2023-09-13 DIAGNOSIS — I10 Essential (primary) hypertension: Secondary | ICD-10-CM | POA: Diagnosis not present

## 2023-09-13 DIAGNOSIS — E1165 Type 2 diabetes mellitus with hyperglycemia: Secondary | ICD-10-CM | POA: Diagnosis not present

## 2023-09-13 DIAGNOSIS — N1832 Chronic kidney disease, stage 3b: Secondary | ICD-10-CM | POA: Diagnosis not present

## 2023-10-20 ENCOUNTER — Encounter (HOSPITAL_BASED_OUTPATIENT_CLINIC_OR_DEPARTMENT_OTHER): Payer: Self-pay

## 2023-10-20 ENCOUNTER — Other Ambulatory Visit: Payer: Self-pay

## 2023-10-20 DIAGNOSIS — Z23 Encounter for immunization: Secondary | ICD-10-CM | POA: Diagnosis not present

## 2023-10-20 DIAGNOSIS — I10 Essential (primary) hypertension: Secondary | ICD-10-CM | POA: Diagnosis not present

## 2023-10-20 DIAGNOSIS — S61012A Laceration without foreign body of left thumb without damage to nail, initial encounter: Secondary | ICD-10-CM | POA: Insufficient documentation

## 2023-10-20 DIAGNOSIS — Z5321 Procedure and treatment not carried out due to patient leaving prior to being seen by health care provider: Secondary | ICD-10-CM | POA: Insufficient documentation

## 2023-10-20 DIAGNOSIS — Z79899 Other long term (current) drug therapy: Secondary | ICD-10-CM | POA: Diagnosis not present

## 2023-10-20 DIAGNOSIS — Y92000 Kitchen of unspecified non-institutional (private) residence as  the place of occurrence of the external cause: Secondary | ICD-10-CM | POA: Insufficient documentation

## 2023-10-20 DIAGNOSIS — W260XXA Contact with knife, initial encounter: Secondary | ICD-10-CM | POA: Diagnosis not present

## 2023-10-20 NOTE — ED Triage Notes (Signed)
 Pt has cut her left thumb with a kitchen knife. Has a well approximated cut  0.5 cm in length over the proximal joint

## 2023-10-21 ENCOUNTER — Encounter (HOSPITAL_BASED_OUTPATIENT_CLINIC_OR_DEPARTMENT_OTHER): Payer: Self-pay | Admitting: Emergency Medicine

## 2023-10-21 ENCOUNTER — Emergency Department (HOSPITAL_BASED_OUTPATIENT_CLINIC_OR_DEPARTMENT_OTHER)
Admission: EM | Admit: 2023-10-21 | Discharge: 2023-10-21 | Discharge status: Against Medical Advice | Attending: Emergency Medicine | Admitting: Emergency Medicine

## 2023-10-21 ENCOUNTER — Emergency Department (HOSPITAL_BASED_OUTPATIENT_CLINIC_OR_DEPARTMENT_OTHER)
Admission: EM | Admit: 2023-10-21 | Discharge: 2023-10-21 | Disposition: A | Discharge status: Home / Self Care | Source: Home / Self Care | Attending: Emergency Medicine | Admitting: Emergency Medicine

## 2023-10-21 DIAGNOSIS — Z79899 Other long term (current) drug therapy: Secondary | ICD-10-CM | POA: Insufficient documentation

## 2023-10-21 DIAGNOSIS — W260XXA Contact with knife, initial encounter: Secondary | ICD-10-CM | POA: Insufficient documentation

## 2023-10-21 DIAGNOSIS — S61012A Laceration without foreign body of left thumb without damage to nail, initial encounter: Secondary | ICD-10-CM | POA: Diagnosis not present

## 2023-10-21 DIAGNOSIS — I10 Essential (primary) hypertension: Secondary | ICD-10-CM | POA: Insufficient documentation

## 2023-10-21 DIAGNOSIS — Z23 Encounter for immunization: Secondary | ICD-10-CM | POA: Insufficient documentation

## 2023-10-21 MED ORDER — TETANUS-DIPHTH-ACELL PERTUSSIS 5-2.5-18.5 LF-MCG/0.5 IM SUSY
0.5000 mL | PREFILLED_SYRINGE | Freq: Once | INTRAMUSCULAR | Status: AC
  Administered 2023-10-21: 0.5 mL via INTRAMUSCULAR
  Filled 2023-10-21: qty 0.5

## 2023-10-21 NOTE — ED Triage Notes (Signed)
 Left thumb lac.  Cut with kitchen knife last night Was here but left prior to being seen Needs tetanus

## 2023-10-21 NOTE — Discharge Instructions (Addendum)
 It was a pleasure caring for you today.  Please follow-up with your primary care provider.  Seek emergency care experiencing any new or worsening symptoms.  Alternating between 650 mg Tylenol and 400 mg Advil: The best way to alternate taking Acetaminophen (example Tylenol) and Ibuprofen (example Advil/Motrin) is to take them 3 hours apart. For example, if you take ibuprofen at 6 am you can then take Tylenol at 9 am. You can continue this regimen throughout the day, making sure you do not exceed the recommended maximum dose for each drug.

## 2023-10-21 NOTE — ED Provider Notes (Signed)
 [place] EMERGENCY DEPARTMENT AT [facility] Provider Note   CSN: [id number] Arrival date & time: 10/21/23  1206     Patient presents with: Extremity Laceration   Robyn [name] is a 78 y.o. female with PMHx OA, DM, GERD, HTN who presents to ED concerned for laceration on left thumb. Patient is right-hand dominant. Patient was attempting to cut zip-ties yesterday when the knife slipped and cut her left thumb. Patient stating that she is not up to date on her tetanus booster. This laceration happened yesterday afternoon. Patient came to ED last night but left d/t wait times. Patient denies any other acute symptoms today.  Patient is sure that there was no foreign bodies as the knife that caused the laceration is intact.    HPI     Prior to Admission medications   Medication Sig Start Date End Date Taking? Authorizing Provider  acetaminophen  (TYLENOL ) 325 MG tablet Take 650 mg by mouth every 6 (six) hours as needed for mild pain or moderate pain.    [provider]  amLODipine  (NORVASC ) 5 MG tablet Take 5 mg by mouth daily.    [provider]  Calcium  Carbonate-Vitamin D (CALCIUM  600+D PO) Take 1 tablet by mouth daily.    [provider]  diclofenac  sodium (VOLTAREN ) 1 % GEL Apply 2 g topically 4 (four) times daily. Patient taking differently: Apply 2 g topically daily.  10/03/17   Jerri Kay HERO, MD  diphenhydramine -acetaminophen  (TYLENOL  PM) 25-500 MG TABS tablet Take 2 tablets by mouth at bedtime.    [provider]  glimepiride  (AMARYL ) 4 MG tablet Take 4 mg by mouth 2 (two) times daily.     [provider]  HYDROcodone -acetaminophen  (NORCO) 5-325 MG tablet Take 1-2 tablets by mouth 2 (two) times daily as needed. 07/17/18   Jerri Kay HERO, MD  lisinopril -hydrochlorothiazide  (PRINZIDE ,ZESTORETIC ) 20-25 MG per tablet Take 1 tablet by mouth daily.    [provider]  Methylcellulose, Laxative, (CITRUCEL PO) Take 1 tablet  by mouth daily.    [provider]  Multiple Vitamin (MULTIVITAMIN WITH MINERALS) TABS tablet Take 1 tablet by mouth at bedtime.    [provider]  ondansetron  (ZOFRAN ) 4 MG tablet Take 1-2 tablets (4-8 mg total) by mouth every 8 (eight) hours as needed for nausea or vomiting. 07/17/18   Jerri Kay HERO, MD  OVER THE COUNTER MEDICATION Take 1 tablet by mouth 2 (two) times daily. GBCQ    [provider]  pantoprazole  (PROTONIX ) 40 MG tablet Take 40 mg by mouth See admin instructions. Take 40 mg by mouth daily for 14 days, do not take for 14 days then repeat.    [provider]  polycarbophil (FIBERCON) 625 MG tablet Take 625 mg by mouth daily.    [provider]  rosuvastatin  (CRESTOR ) 20 MG tablet Take 20 mg by mouth daily.    [provider]  vitamin C (ASCORBIC ACID) 500 MG tablet Take 500 mg by mouth 2 (two) times daily.    [provider]  Vitamin E 400 units TABS Take 400 Units by mouth daily.     [provider]    Allergies: Ibuprofen, Lipitor [atorvastatin ], Metformin and related, and Codeine    Review of Systems  Skin:        laceration    Updated Vital Signs BP (!) 143/58 (BP Location: Right Arm)   Pulse (!) 58   Temp 98.4 F (36.9 C) (Oral)   Resp 18  SpO2 98%   Physical Exam Vitals and nursing note reviewed.  Constitutional:      General: She is not in acute distress.    Appearance: She is not ill-appearing or toxic-appearing.  HENT:     Head: Normocephalic and atraumatic.   Eyes:     General: No scleral icterus.       Right eye: No discharge.        Left eye: No discharge.     Conjunctiva/sclera: Conjunctivae normal.    Cardiovascular:     Rate and Rhythm: Normal rate.  Pulmonary:     Effort: Pulmonary effort is normal.  Abdominal:     General: Abdomen is flat.   Skin:    General: Skin is warm and dry.     Comments: 1cm laceration of left lateral thumb near PIP. No active bleeding. No  surrounding erythema or swelling. Thumb ROM intact. Brisk capillary refill. +2 radial pulse. Area non-tense.   Neurological:     General: No focal deficit present.     Mental Status: She is alert. Mental status is at baseline.   Psychiatric:        Mood and Affect: Mood normal.        Behavior: Behavior normal.     (all labs ordered are listed, but only abnormal results are displayed) Labs Reviewed - No data to display  EKG: None  Radiology: No results found.   .Laceration Repair  Date/Time: 10/21/2023 1:29 PM  Performed by: Hoy Nidia FALCON, PA-C Authorized by: Hoy Nidia FALCON, PA-C   Consent:    Consent obtained:  Verbal   Consent given by:  Patient   Risks discussed:  Infection, nerve damage, need for additional repair, pain, poor cosmetic result, poor wound healing, retained foreign body, tendon damage and vascular damage Universal protocol:    Patient identity confirmed:  Verbally with patient Laceration details:    Location:  Finger   Finger location:  L thumb   Length (cm):  1 Treatment:    Area cleansed with:  Povidone-iodine   Amount of cleaning:  Standard   Irrigation solution:  Sterile saline   Irrigation volume:    Irrigation method:  Pressure wash Skin repair:    Repair method:  Steri-Strips and tissue adhesive   Number of Steri-Strips:  1 Approximation:    Approximation:  Close Repair type:    Repair type:  Simple Post-procedure details:    Dressing:  Non-adherent dressing   Procedure completion:  Tolerated well, no immediate complications    Medications Ordered in the ED  Tdap (BOOSTRIX) injection 0.5 mL (has no administration in time range)                                    Medical Decision Making Risk Prescription drug management.   This patient presents to the ED for concern of a  1 cm simple laceration to their left thumb, this involves an extensive number of treatment options, and is a complaint that carries with  it a high risk of complications and morbidity.  The differential diagnosis includes foreign body, fracture, NV compromise. These are considered less likely due to history of present illness and physical exam findings.   Co morbidities that complicate the patient evaluation  OA, DM, GERD, HTN   Additional history obtained:  Dr. Delice PCP   Problem List / ED Course / Critical interventions / Medication  management  Patient presented for a 1 cm laceration to their left thumb. They are neurovascularly intact. Tetanus was provided in ED today. Patient is in no distress. Laceration is almost 24 hours old. Shared decision making with patient who agrees to proceed with dermabond and steri-strips for this 1cm laceration today. Patient/family educated about follow-up with PCP.  Laceration repaired without complication and with pulse, motor, sensation intact.  All education provided in verbal form with additional information in written form. Time was allowed for answering of patient questions.  Staffed with Dr. Lenor. I have reviewed the patients home medicines and have made adjustments as needed The patient has been appropriately medically screened and/or stabilized in the ED. I have low suspicion for any other emergent medical condition which would require further screening, evaluation or treatment in the ED or require inpatient management. At time of discharge the patient is hemodynamically stable and in no acute distress. I have discussed work-up results and diagnosis with patient and answered all questions. Patient is agreeable with discharge plan. We discussed strict return precautions for returning to the emergency department and they verbalized understanding.     Social Determinants of Health:  geriatric        Final diagnoses:  Laceration of left thumb without damage to nail, foreign body presence unspecified, initial encounter    ED Discharge Orders     None           Hoy Nidia FALCON, [place] 10/21/23 1345    Lenor Hollering, MD 10/21/23 1431

## 2023-10-21 NOTE — ED Notes (Signed)
 Reviewed AVS/discharge instruction with patient. Time allotted for and all questions answered. Patient is agreeable for d/c and escorted to ed exit by staff.

## 2023-11-22 DIAGNOSIS — N1832 Chronic kidney disease, stage 3b: Secondary | ICD-10-CM | POA: Diagnosis not present

## 2023-11-22 DIAGNOSIS — E1165 Type 2 diabetes mellitus with hyperglycemia: Secondary | ICD-10-CM | POA: Diagnosis not present

## 2023-11-22 DIAGNOSIS — E1140 Type 2 diabetes mellitus with diabetic neuropathy, unspecified: Secondary | ICD-10-CM | POA: Diagnosis not present

## 2023-11-22 DIAGNOSIS — E669 Obesity, unspecified: Secondary | ICD-10-CM | POA: Diagnosis not present

## 2023-11-26 DIAGNOSIS — E119 Type 2 diabetes mellitus without complications: Secondary | ICD-10-CM | POA: Diagnosis not present

## 2023-11-26 DIAGNOSIS — H25013 Cortical age-related cataract, bilateral: Secondary | ICD-10-CM | POA: Diagnosis not present

## 2023-11-26 DIAGNOSIS — H2513 Age-related nuclear cataract, bilateral: Secondary | ICD-10-CM | POA: Diagnosis not present

## 2023-11-26 DIAGNOSIS — H25043 Posterior subcapsular polar age-related cataract, bilateral: Secondary | ICD-10-CM | POA: Diagnosis not present

## 2023-11-26 DIAGNOSIS — H2512 Age-related nuclear cataract, left eye: Secondary | ICD-10-CM | POA: Diagnosis not present

## 2023-12-23 DIAGNOSIS — E1140 Type 2 diabetes mellitus with diabetic neuropathy, unspecified: Secondary | ICD-10-CM | POA: Diagnosis not present

## 2023-12-23 DIAGNOSIS — E669 Obesity, unspecified: Secondary | ICD-10-CM | POA: Diagnosis not present

## 2023-12-23 DIAGNOSIS — N1832 Chronic kidney disease, stage 3b: Secondary | ICD-10-CM | POA: Diagnosis not present

## 2023-12-23 DIAGNOSIS — E1165 Type 2 diabetes mellitus with hyperglycemia: Secondary | ICD-10-CM | POA: Diagnosis not present

## 2024-01-10 DIAGNOSIS — H2513 Age-related nuclear cataract, bilateral: Secondary | ICD-10-CM | POA: Diagnosis not present

## 2024-01-10 DIAGNOSIS — H2512 Age-related nuclear cataract, left eye: Secondary | ICD-10-CM | POA: Diagnosis not present

## 2024-01-11 DIAGNOSIS — H25011 Cortical age-related cataract, right eye: Secondary | ICD-10-CM | POA: Diagnosis not present

## 2024-01-11 DIAGNOSIS — H25041 Posterior subcapsular polar age-related cataract, right eye: Secondary | ICD-10-CM | POA: Diagnosis not present

## 2024-01-11 DIAGNOSIS — H2511 Age-related nuclear cataract, right eye: Secondary | ICD-10-CM | POA: Diagnosis not present

## 2024-01-16 DIAGNOSIS — I10 Essential (primary) hypertension: Secondary | ICD-10-CM | POA: Diagnosis not present

## 2024-01-16 DIAGNOSIS — E1165 Type 2 diabetes mellitus with hyperglycemia: Secondary | ICD-10-CM | POA: Diagnosis not present

## 2024-01-16 DIAGNOSIS — N1832 Chronic kidney disease, stage 3b: Secondary | ICD-10-CM | POA: Diagnosis not present

## 2024-01-16 DIAGNOSIS — R0789 Other chest pain: Secondary | ICD-10-CM | POA: Diagnosis not present

## 2024-01-31 DIAGNOSIS — H2513 Age-related nuclear cataract, bilateral: Secondary | ICD-10-CM | POA: Diagnosis not present

## 2024-01-31 DIAGNOSIS — H2511 Age-related nuclear cataract, right eye: Secondary | ICD-10-CM | POA: Diagnosis not present
